# Patient Record
Sex: Male | Born: 1960 | Race: White | Hispanic: No | Marital: Married | Smoking: Former smoker
Health system: Southern US, Community
[De-identification: ages and names within clinical notes are randomized; demographics above are authoritative.]

## PROBLEM LIST (undated history)

## (undated) DIAGNOSIS — E871 Hypo-osmolality and hyponatremia: Secondary | ICD-10-CM

## (undated) DIAGNOSIS — M109 Gout, unspecified: Secondary | ICD-10-CM

## (undated) DIAGNOSIS — D649 Anemia, unspecified: Secondary | ICD-10-CM

## (undated) DIAGNOSIS — K5090 Crohn's disease, unspecified, without complications: Secondary | ICD-10-CM

## (undated) DIAGNOSIS — K2100 Gastro-esophageal reflux disease with esophagitis, without bleeding: Secondary | ICD-10-CM

## (undated) DIAGNOSIS — K5190 Ulcerative colitis, unspecified, without complications: Secondary | ICD-10-CM

## (undated) DIAGNOSIS — E612 Magnesium deficiency: Secondary | ICD-10-CM

## (undated) DIAGNOSIS — H409 Unspecified glaucoma: Secondary | ICD-10-CM

## (undated) DIAGNOSIS — K2970 Gastritis, unspecified, without bleeding: Secondary | ICD-10-CM

## (undated) DIAGNOSIS — E119 Type 2 diabetes mellitus without complications: Secondary | ICD-10-CM

## (undated) DIAGNOSIS — H25019 Cortical age-related cataract, unspecified eye: Secondary | ICD-10-CM

## (undated) DIAGNOSIS — K649 Unspecified hemorrhoids: Secondary | ICD-10-CM

## (undated) HISTORY — PX: OTHER SURGICAL HISTORY: SHX169

## (undated) HISTORY — PX: TONSILLECTOMY: SUR1361

## (undated) HISTORY — DX: Crohn's disease, unspecified, without complications: K50.90

## (undated) HISTORY — PX: COLONOSCOPY: SHX174

## (undated) HISTORY — PX: FRACTURE SURGERY: SHX138

## (undated) HISTORY — PX: BACK SURGERY: SHX140

## (undated) HISTORY — PX: EYE SURGERY: SHX253

---

## 2008-03-05 ENCOUNTER — Encounter: Payer: Self-pay | Admitting: Internal Medicine

## 2008-03-05 ENCOUNTER — Ambulatory Visit: Payer: Self-pay | Admitting: Internal Medicine

## 2008-03-05 ENCOUNTER — Observation Stay (HOSPITAL_COMMUNITY): Admission: EM | Admit: 2008-03-05 | Discharge: 2008-03-06 | Payer: Self-pay | Admitting: Emergency Medicine

## 2008-03-05 IMAGING — CR DG CHEST 2V
2 series · 2 of 2 positions shown · non-contrast
Comparison: None

CLINICAL DATA: Chest pain.

CHEST - 2 VIEW

[w chest pa]
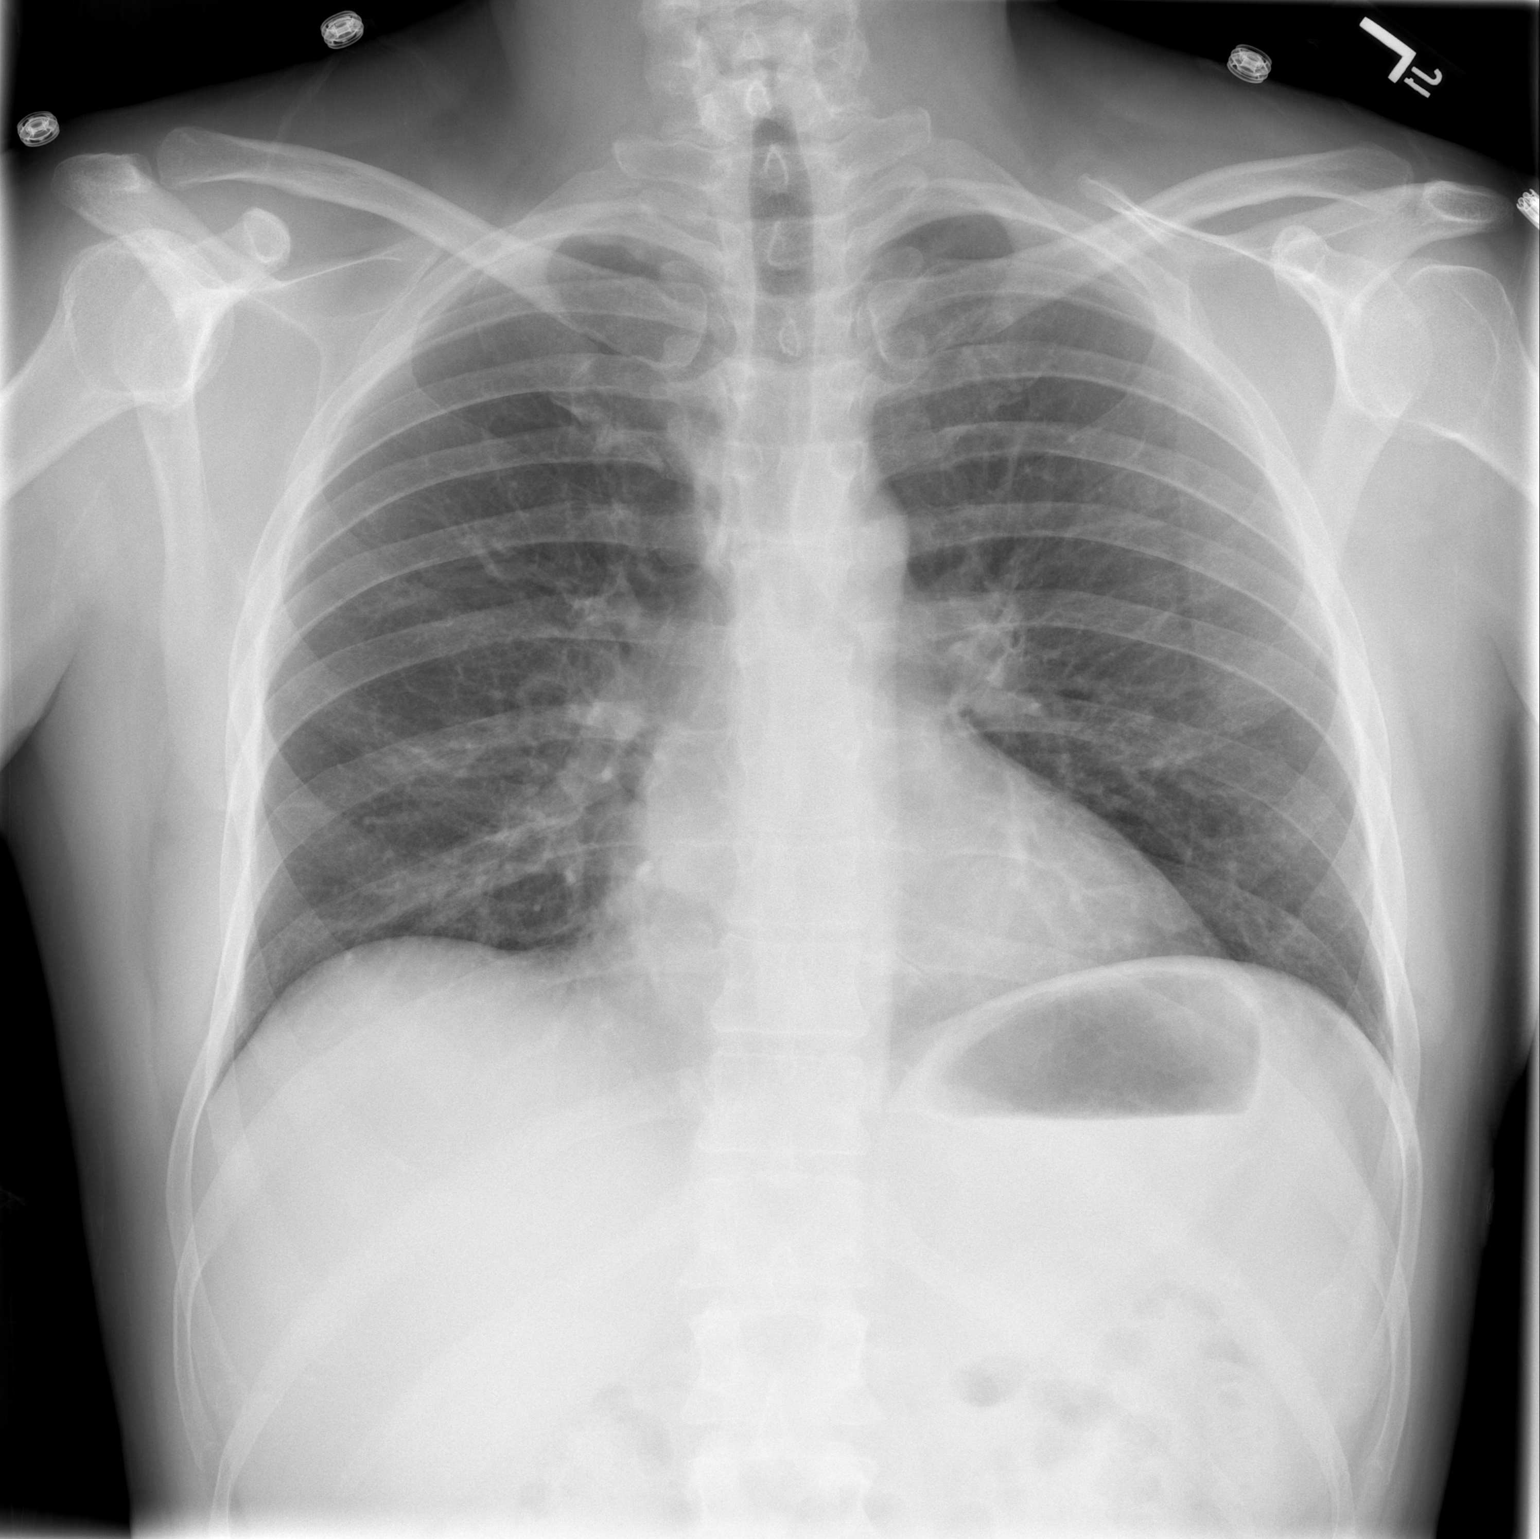

[w chest lat]
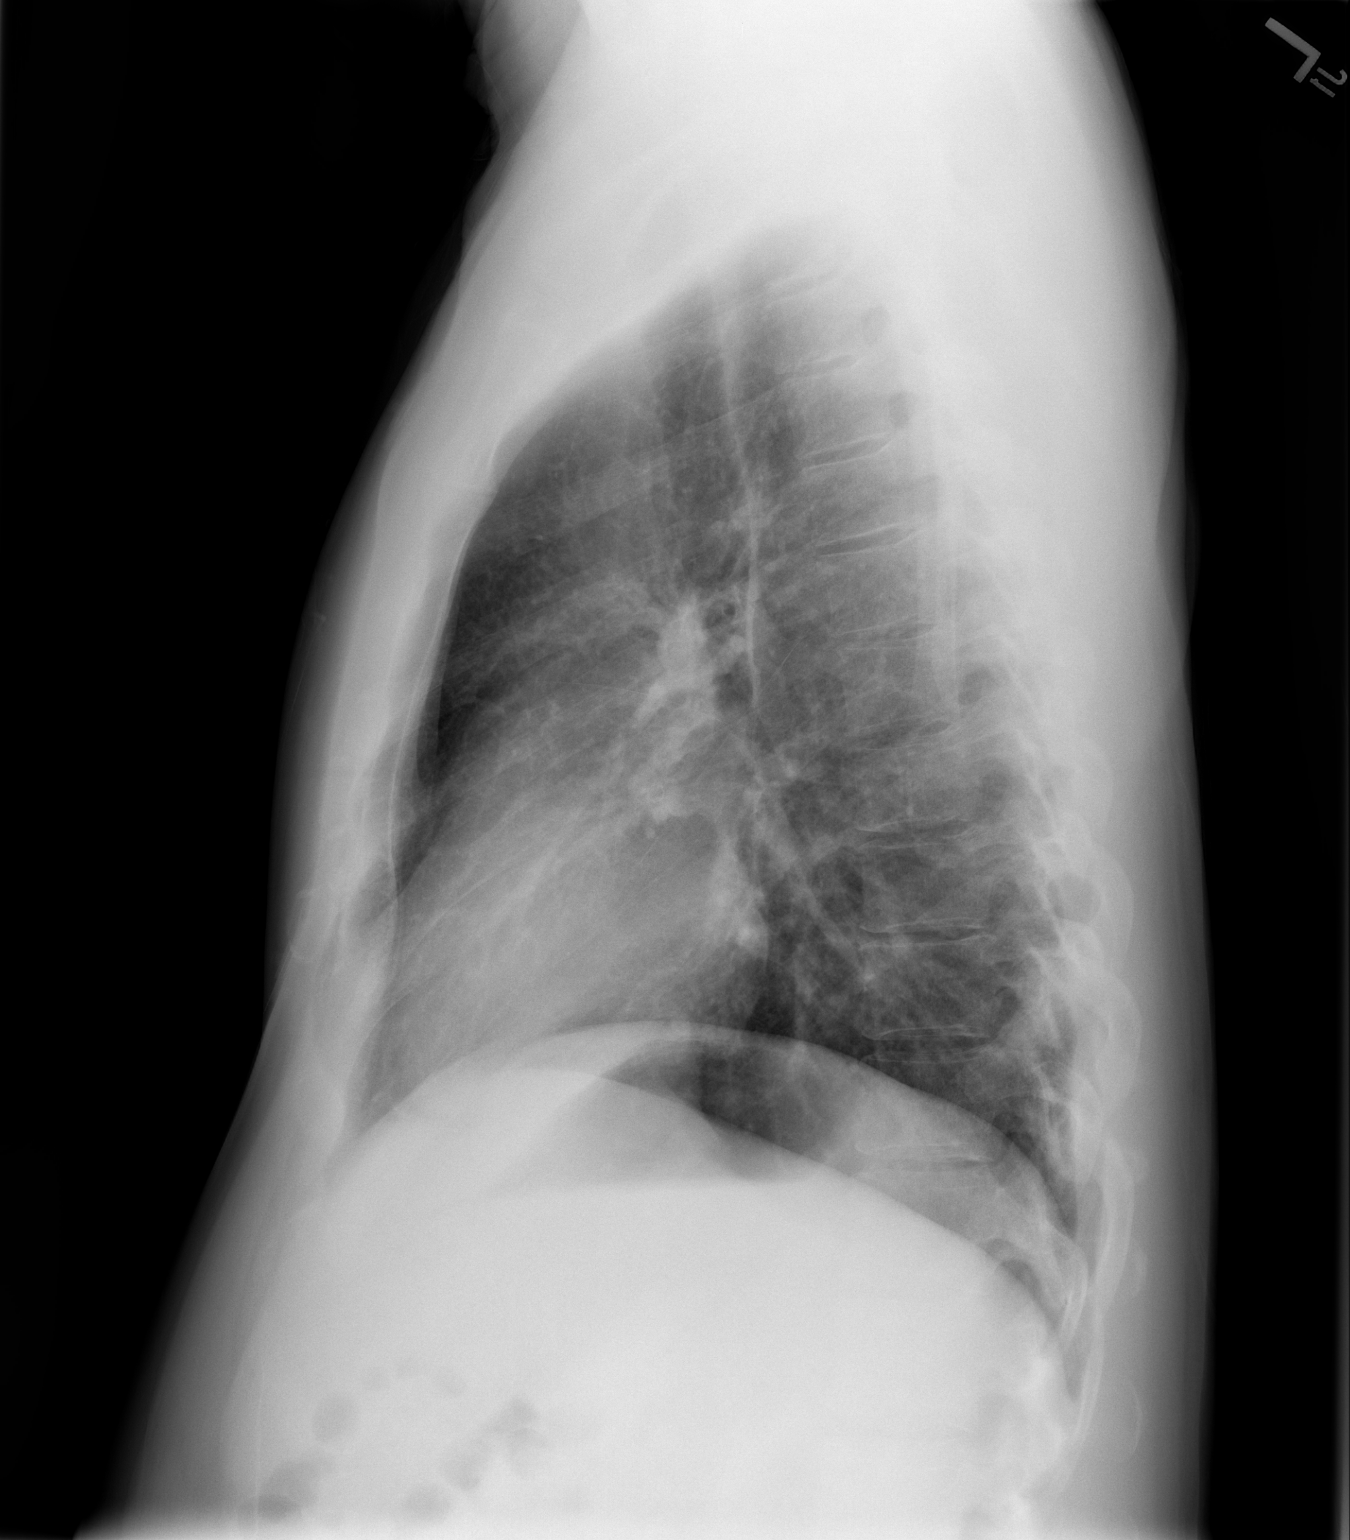

[2 of 2 positions shown; findings below may reference images not displayed]

FINDINGS: Trachea midline.  Heart size is accentuated by slightly
low lung volumes.  Lungs are clear.  No pleural fluid.
IMPRESSION: No acute findings.

## 2009-06-25 ENCOUNTER — Ambulatory Visit (HOSPITAL_COMMUNITY): Admission: RE | Admit: 2009-06-25 | Discharge: 2009-06-25 | Payer: Self-pay | Admitting: Neurosurgery

## 2010-03-23 ENCOUNTER — Ambulatory Visit (HOSPITAL_COMMUNITY): Admission: RE | Admit: 2010-03-23 | Discharge: 2010-03-23 | Payer: Self-pay | Admitting: Neurosurgery

## 2010-06-14 ENCOUNTER — Ambulatory Visit: Payer: Self-pay | Admitting: Family Medicine

## 2010-06-23 ENCOUNTER — Ambulatory Visit: Payer: Self-pay | Admitting: Family Medicine

## 2010-07-22 ENCOUNTER — Ambulatory Visit: Payer: Self-pay | Admitting: Family Medicine

## 2010-08-04 LAB — SURGICAL PCR SCREEN
MRSA, PCR: NEGATIVE
Staphylococcus aureus: POSITIVE — AB

## 2010-08-04 LAB — BASIC METABOLIC PANEL
BUN: 13 mg/dL (ref 6–23)
Calcium: 9.5 mg/dL (ref 8.4–10.5)
Creatinine, Ser: 1.09 mg/dL (ref 0.4–1.5)
GFR calc non Af Amer: >60 mL/min (ref >60)
Glucose, Bld: 236 mg/dL — ABNORMAL HIGH (ref 70–99)
Sodium: 134 mEq/L — ABNORMAL LOW (ref 135–145)

## 2010-08-04 LAB — CBC
MCHC: 33.7 g/dL (ref 30.0–36.0)
Platelets: 116 10*3/uL — ABNORMAL LOW (ref 150–400)
RDW: 12.6 % (ref 11.5–15.5)

## 2010-08-11 LAB — HEPATIC FUNCTION PANEL
ALT: 34 U/L (ref 0–53)
AST: 27 U/L (ref 0–37)
Indirect Bilirubin: 0.6 mg/dL (ref 0.3–0.9)
Total Protein: 7.2 g/dL (ref 6.0–8.3)

## 2010-08-11 LAB — BASIC METABOLIC PANEL
BUN: 8 mg/dL (ref 6–23)
Creatinine, Ser: 0.86 mg/dL (ref 0.4–1.5)
GFR calc non Af Amer: >60 mL/min (ref >60)

## 2010-08-11 LAB — CBC
Platelets: 154 10*3/uL (ref 150–400)
WBC: 9.2 10*3/uL (ref 4.0–10.5)

## 2010-10-08 NOTE — Discharge Summary (Signed)
NAMESIERRA, Hicks NO.:  [id number]   MEDICAL RECORD NO.:  [id number]          PATIENT TYPE:  OBV   LOCATION:  2037                         FACILITY:  [facility]   PHYSICIAN:  Manning Charity, MD     DATE OF BIRTH:  08-18-1960   DATE OF ADMISSION:  03/05/2008  DATE OF DISCHARGE:  03/06/2008                               DISCHARGE SUMMARY   ATTENDING PHYSICIAN:  Manning Charity, MD   DISCHARGE DIAGNOSES:  1. Chest pain, presumed gastrointestinal in origin given negative      cardiac workup and history of ethanol abuse and heavy use of      nonsteroidal anti-inflammatory drugs.  2. Ethanol abuse.  3. Tobacco abuse.  4. Glaucoma.  5. Gout.   DISCHARGE MEDICATIONS:  1. Lumigan eye drops use as directed.  2. Colchicine 0.6 mg p.o. every other day.  3. Tramadol 50 mg p.o. q.4-6 h. p.r.n. pain.  4. Omeprazole 40 mg p.o. q.12 h.   DISCHARGE CONDITION AND FOLLOWUP:  The patient was discharged in stable  condition after having been ruled out for acute myocardial infarction  with negative EKGs and cardiac enzymes.  He is to follow up with Dr.  Elizebeth Koller in the outpatient clinic at 12:30 p.m. on April 03, 2008.  At that time, items which need to be addressed are,  1. Gout: is his current colchicine dose is effective preventing gout      flares? We should consider checking 24h urine for uric acid to      determine excretion. Also, in the future he will be a candidate for      allopurinol once he is not have acute attacks.  2. His alcohol use and whether he has been successful in cutting down.  3. Reviewing with him the importance of avoiding NSAIDs for pain      management given their likelihood to exacerbate gastritis      especially in combination with alcohol.  4. He will need referral to cardiology for outpatient evaluation given      his chest pain (likely he will need an exercise stress test).   PROCEDURES PERFORMED:  Chest x-ray dated March 05, 2008 showed  no  acute findings.  Abdominal x-ray dated March 05, 2008, showed a  nonspecific bowel gas pattern, scattered left-sided small bowel air-  fluid levels with borderline distended bowel.  A 2-D echocardiogram  dated March 05, 2008, showed overall left ventricular systolic  function was vigorous.  Left ventricular ejection fraction estimated to  be 65%.  There were no left ventricular regional wall motion  abnormalities.  Features were consistent with pseudonormal left  ventricular filling pattern with concomitant abnormal relaxation and  increased filling pressure.  It was technically a limited study due to  poor sound wave transmission.  There was mild mitral annular  calcification.  The patient needs an exercise stress test as an  outpatient.   BRIEF ADMITTING HISTORY AND PHYSICAL:  The patient is a 50 year old man  with past medical history significant for tobacco abuse, ethanol abuse  as well as gout and chronic Advil usage who  presented to the emergency  room with chest pain.  He reports onset of chest pain at 7:45 a.m. on  the day of admission, lasting approximately 45 minutes.  He stayed at  work and had 2 more brief episodes, each of which lasted a couple  minutes.  First episode was associated with diaphoresis.  He describes  the chest pain as epigastric and below the sternum, it is like pressure  without radiation.  He has had no similar episodes previously.  It was  initially 8/10, but was 5/10 in the ED.  There were no aggravating or  alleviating factors.  No sick contacts, no shortness of breath, no  cough, no fevers, no chills.  The patient drinks at least a 6-pack per  day and uses Advil daily for arthritis pain and gout.   PHYSICAL EXAMINATION:  Vitals; blood pressure 159/90, pulse 72,  respirations 17, oxygen saturation 99% on 2 liters.  In general, he was  in no acute distress.  There was no JVD.  Lungs were clear to  auscultation bilaterally.  Heart had regular  rate and rhythm.  Normal S1  and S2.  No murmurs, rubs, or gallops.  Abdomen was soft, nontender.  Bowel sounds were positive.  There was no guarding or rebound.  Extremities had no edema.  The neuro exam was nonfocal.   Labs were sodium 138, potassium 4.2, chloride 105, bicarb 25, BUN 9,  creatinine 1.0, glucose 156.  Point of care cardiac markers were  negative.  Hemoglobin was 16.7.  Chest x-ray was obtained with findings  described above.   HOSPITAL COURSE:  1. Chest/epigastric pain:  The patient was ruled out for acute      myocardial infarction with negative serial cardiac enzymes and      EKGs.  His 2-D echo had the findings described above.  His lipase      was 35.  His BNP was less than 30.  His urine drug screen was      negative.  His LFTs were within normal limits.  The most likely      etiology for his epigastric pain was GERD/peptic ulcer disease      given heavy ethanol use combined with NSAID use.  His risk factor      for coronary artery disease is former tobacco use, but he had a      very low pretest probability and therefore can receive cardiac      workup as an outpatient.  The patient was advised to stop his      alcohol intake (he was given the phone number of AA) and to stop      using NSAIDs.  2. Gout:  The patient was advised to stop using NSAIDs for his gout.      He was given a prescription for colchicine instead as well as      tramadol for pain.  Once he is free of any acute attack in the      future, we can consider to start allopurinol.  He was also advised      to avoid Tylenol given his history of ethanol use.   DISCHARGE LABORATORIES AND VITALS:  Temperature 96.7, pulse 75,  respirations 18, blood pressure 126/83, oxygen saturation 100% on room  air.  White blood count 5.9, hemoglobin 15.4, platelets 165.  Sodium  136, potassium 3.7, chloride 104, bicarb 25, glucose 128, BUN 12,  creatinine 0.89, calcium 9.4.      Theron Arista  Janyth Pupa, MD   Electronically Signed      Manning Charity, MD  Electronically Signed    PN/MEDQ  D:  03/10/2008  T:  03/11/2008  Job:  [id number]

## 2011-02-22 LAB — RAPID URINE DRUG SCREEN, HOSP PERFORMED
Amphetamines: NOT DETECTED
Benzodiazepines: NOT DETECTED
Cocaine: NOT DETECTED
Opiates: NOT DETECTED
Tetrahydrocannabinol: NOT DETECTED

## 2011-02-22 LAB — COMPREHENSIVE METABOLIC PANEL
AST: 35
BUN: 10
CO2: 24
Calcium: 9.7
Creatinine, Ser: 0.82
GFR calc Af Amer: >60
GFR calc non Af Amer: >60
Glucose, Bld: 148 — ABNORMAL HIGH
Total Bilirubin: 0.7

## 2011-02-22 LAB — LIPID PANEL
Total CHOL/HDL Ratio: 5.5
VLDL: 40

## 2011-02-22 LAB — GLUCOSE, CAPILLARY
Glucose-Capillary: 130 — ABNORMAL HIGH
Glucose-Capillary: 87

## 2011-02-22 LAB — VITAMIN B12: Vitamin B-12: 534 (ref 211–911)

## 2011-02-22 LAB — CBC
HCT: 45.5
Hemoglobin: 15.4
MCHC: 33.9
RBC: 4.61
RDW: 13.2

## 2011-02-22 LAB — FOLATE RBC: RBC Folate: 752 — ABNORMAL HIGH

## 2011-02-22 LAB — FOLATE: Folate: 12.5

## 2011-02-22 LAB — AMYLASE: Amylase: 86

## 2011-02-22 LAB — HEMOGLOBIN A1C
Hgb A1c MFr Bld: 6
Mean Plasma Glucose: 126

## 2011-02-22 LAB — BASIC METABOLIC PANEL
BUN: 12
Calcium: 9.4
Creatinine, Ser: 0.89
GFR calc non Af Amer: >60
Glucose, Bld: 128 — ABNORMAL HIGH
Sodium: 136

## 2011-02-22 LAB — POCT I-STAT, CHEM 8
BUN: 9
Calcium, Ion: 1.24
Chloride: 105
Glucose, Bld: 156 — ABNORMAL HIGH
Potassium: 4.2

## 2011-02-22 LAB — D-DIMER, QUANTITATIVE: D-Dimer, Quant: 0.36

## 2011-02-22 LAB — OCCULT BLOOD X 1 CARD TO LAB, STOOL: Fecal Occult Bld: NEGATIVE

## 2011-02-22 LAB — POCT CARDIAC MARKERS
Myoglobin, poc: 37.2
Troponin i, poc: <0.05

## 2011-02-22 LAB — PROTIME-INR: Prothrombin Time: 13.5

## 2011-02-22 LAB — CK TOTAL AND CKMB (NOT AT ARMC): Relative Index: INVALID

## 2011-02-22 LAB — B-NATRIURETIC PEPTIDE (CONVERTED LAB): Pro B Natriuretic peptide (BNP): <30

## 2011-02-22 LAB — CARDIAC PANEL(CRET KIN+CKTOT+MB+TROPI)
CK, MB: 1
Total CK: 46

## 2011-05-03 ENCOUNTER — Ambulatory Visit: Payer: Self-pay | Admitting: Gastroenterology

## 2011-05-04 LAB — PATHOLOGY REPORT

## 2013-02-26 ENCOUNTER — Ambulatory Visit: Payer: Self-pay | Admitting: Surgery

## 2013-02-26 LAB — CBC WITH DIFFERENTIAL/PLATELET
Basophil #: 0 x10 3/mm 3
Basophil %: 0.6 %
Eosinophil #: 0.1 x10 3/mm 3
Eosinophil %: 1.6 %
HCT: 51.2 %
HGB: 16.7 g/dL
Lymphocyte %: 17 %
Lymphs Abs: 1.1 x10 3/mm 3
MCH: 32.6 pg
MCHC: 32.5 g/dL
MCV: 100 fL
Monocyte #: 0.4 "x10 3/mm "
Monocyte %: 6.9 %
Neutrophil #: 4.6 x10 3/mm 3
Neutrophil %: 73.9 %
Platelet: 173 x10 3/mm 3
RBC: 5.11 x10 6/mm 3
RDW: 14 %
WBC: 6.2 x10 3/mm 3

## 2013-02-26 LAB — BASIC METABOLIC PANEL WITH GFR
Anion Gap: 8
BUN: 9 mg/dL
Calcium, Total: 8.9 mg/dL
Chloride: 97 mmol/L — ABNORMAL LOW
Co2: 28 mmol/L
Creatinine: 1.11 mg/dL
EGFR (African American): >60
EGFR (Non-African Amer.): >60
Glucose: 371 mg/dL — ABNORMAL HIGH
Osmolality: 280
Potassium: 3.5 mmol/L
Sodium: 133 mmol/L — ABNORMAL LOW

## 2013-03-07 ENCOUNTER — Ambulatory Visit: Payer: Self-pay | Admitting: Surgery

## 2013-04-15 ENCOUNTER — Inpatient Hospital Stay: Payer: Self-pay | Admitting: Internal Medicine

## 2013-04-15 LAB — URINALYSIS, COMPLETE
Bilirubin,UR: NEGATIVE
Blood: NEGATIVE
Glucose,UR: 50 mg/dL (ref 0–75)
Ph: 6 (ref 4.5–8.0)
Protein: NEGATIVE
RBC,UR: 1 /HPF (ref 0–5)
Specific Gravity: 1.036 (ref 1.003–1.030)
Squamous Epithelial: <1
WBC UR: <1 /HPF (ref 0–5)

## 2013-04-15 LAB — RAPID INFLUENZA A&B ANTIGENS

## 2013-04-15 LAB — COMPREHENSIVE METABOLIC PANEL
Albumin: 2.5 g/dL — ABNORMAL LOW (ref 3.4–5.0)
Alkaline Phosphatase: 135 U/L — ABNORMAL HIGH
BUN: 14 mg/dL (ref 7–18)
Calcium, Total: 9.1 mg/dL (ref 8.5–10.1)
Chloride: 90 mmol/L — ABNORMAL LOW (ref 98–107)
Osmolality: 258 (ref 275–301)
Potassium: 4.2 mmol/L (ref 3.5–5.1)
SGOT(AST): 22 U/L (ref 15–37)
Total Protein: 7.7 g/dL (ref 6.4–8.2)

## 2013-04-15 LAB — CBC
HGB: 14 g/dL (ref 13.0–18.0)
MCH: 32 pg (ref 26.0–34.0)
MCV: 94 fL (ref 80–100)
RBC: 4.36 10*6/uL — ABNORMAL LOW (ref 4.40–5.90)
WBC: 10.6 10*3/uL (ref 3.8–10.6)

## 2013-04-15 LAB — LIPASE, BLOOD: Lipase: 58 U/L — ABNORMAL LOW (ref 73–393)

## 2013-04-16 LAB — CBC WITH DIFFERENTIAL/PLATELET
Basophil #: 0 10*3/uL (ref 0.0–0.1)
Eosinophil #: 0 10*3/uL (ref 0.0–0.7)
HGB: 12.7 g/dL — ABNORMAL LOW (ref 13.0–18.0)
Lymphocyte #: 0.8 10*3/uL — ABNORMAL LOW (ref 1.0–3.6)
Lymphocyte %: 10 %
MCH: 32.2 pg (ref 26.0–34.0)
MCHC: 34.4 g/dL (ref 32.0–36.0)
Platelet: 265 10*3/uL (ref 150–440)
RDW: 13 % (ref 11.5–14.5)
WBC: 8.2 10*3/uL (ref 3.8–10.6)

## 2013-04-16 LAB — MAGNESIUM: Magnesium: 1.3 mg/dL — ABNORMAL LOW

## 2013-04-16 LAB — COMPREHENSIVE METABOLIC PANEL
Anion Gap: 8 (ref 7–16)
Bilirubin,Total: 0.5 mg/dL (ref 0.2–1.0)
Creatinine: 0.85 mg/dL (ref 0.60–1.30)
EGFR (African American): >60
Glucose: 132 mg/dL — ABNORMAL HIGH (ref 65–99)
Osmolality: 259 (ref 275–301)
SGOT(AST): 24 U/L (ref 15–37)
SGPT (ALT): 20 U/L (ref 12–78)
Total Protein: 6.5 g/dL (ref 6.4–8.2)

## 2013-04-16 LAB — CLOSTRIDIUM DIFFICILE(ARMC)

## 2013-04-17 LAB — CBC WITH DIFFERENTIAL/PLATELET
Basophil %: 0.6 %
Eosinophil %: 0.4 %
HCT: 32.1 % — ABNORMAL LOW (ref 40.0–52.0)
Lymphocyte #: 1 10*3/uL (ref 1.0–3.6)
MCH: 32 pg (ref 26.0–34.0)
MCHC: 34.5 g/dL (ref 32.0–36.0)
Monocyte #: 0.9 x10 3/mm (ref 0.2–1.0)
Neutrophil #: 3.9 10*3/uL (ref 1.4–6.5)
Neutrophil %: 65.5 %
RBC: 3.46 10*6/uL — ABNORMAL LOW (ref 4.40–5.90)

## 2013-04-17 LAB — HEPATIC FUNCTION PANEL A (ARMC): Total Protein: 5.7 g/dL — ABNORMAL LOW (ref 6.4–8.2)

## 2013-04-18 LAB — STOOL CULTURE

## 2013-04-19 LAB — BASIC METABOLIC PANEL
Anion Gap: 7 (ref 7–16)
BUN: 4 mg/dL — ABNORMAL LOW (ref 7–18)
Chloride: 101 mmol/L (ref 98–107)
Co2: 26 mmol/L (ref 21–32)
EGFR (African American): >60
Osmolality: 266 (ref 275–301)
Potassium: 2.4 mmol/L — CL (ref 3.5–5.1)
Sodium: 134 mmol/L — ABNORMAL LOW (ref 136–145)

## 2013-04-19 LAB — CBC WITH DIFFERENTIAL/PLATELET
Basophil #: 0 10*3/uL (ref 0.0–0.1)
Eosinophil #: 0.1 10*3/uL (ref 0.0–0.7)
HCT: 32.5 % — ABNORMAL LOW (ref 40.0–52.0)
MCV: 93 fL (ref 80–100)
Monocyte #: 0.8 x10 3/mm (ref 0.2–1.0)
WBC: 5.4 10*3/uL (ref 3.8–10.6)

## 2013-04-19 LAB — MAGNESIUM: Magnesium: 0.9 mg/dL — ABNORMAL LOW

## 2013-04-20 LAB — CULTURE, BLOOD (SINGLE)

## 2013-04-27 LAB — WBCS, STOOL

## 2013-06-07 ENCOUNTER — Ambulatory Visit: Payer: Self-pay | Admitting: Gastroenterology

## 2013-06-07 LAB — CBC WITH DIFFERENTIAL/PLATELET
BASOS ABS: 0 10*3/uL (ref 0.0–0.1)
Basophil %: 0.7 %
Eosinophil #: 0.1 10*3/uL (ref 0.0–0.7)
Eosinophil %: 2.1 %
HCT: 40.1 % (ref 40.0–52.0)
HGB: 13.6 g/dL (ref 13.0–18.0)
Lymphocyte #: 1.9 10*3/uL (ref 1.0–3.6)
Lymphocyte %: 28 %
MCH: 32.2 pg (ref 26.0–34.0)
MCHC: 33.8 g/dL (ref 32.0–36.0)
MCV: 95 fL (ref 80–100)
MONOS PCT: 5.6 %
Monocyte #: 0.4 x10 3/mm (ref 0.2–1.0)
NEUTROS PCT: 63.6 %
Neutrophil #: 4.3 10*3/uL (ref 1.4–6.5)
PLATELETS: 217 10*3/uL (ref 150–440)
RBC: 4.2 10*6/uL — ABNORMAL LOW (ref 4.40–5.90)
RDW: 15.5 % — ABNORMAL HIGH (ref 11.5–14.5)
WBC: 6.8 10*3/uL (ref 3.8–10.6)

## 2013-06-07 LAB — SEDIMENTATION RATE: ERYTHROCYTE SED RATE: 24 mm/h — AB (ref 0–20)

## 2013-06-13 LAB — PATHOLOGY REPORT

## 2014-03-07 ENCOUNTER — Ambulatory Visit: Payer: Self-pay | Admitting: Gastroenterology

## 2014-03-07 LAB — CLOSTRIDIUM DIFFICILE(ARMC)

## 2014-08-01 ENCOUNTER — Other Ambulatory Visit: Payer: Self-pay | Admitting: Gastroenterology

## 2014-09-12 NOTE — Discharge Summary (Signed)
PATIENT NAME:  Timothy Hicks, Timothy Hicks MR#:  [id number] DATE OF BIRTH:  1960/08/16  DATE OF ADMISSION:  04/15/2013 DATE OF DISCHARGE:  04/19/2013  ADMITTING PHYSICIAN: Brinson Sink, MD  DISCHARGING PHYSICIAN: Gladstone Lighter, MD  PRIMARY CARE PHYSICIAN: Juluis Pitch, MD  [place]: Gastroenterology by Dr. Gustavo Lah and Dr. Allen Norris.   DISCHARGE DIAGNOSES: 1.  Systemic antiinflammatory response syndrome. 2.  Acute sigmoid diverticulitis/colitis.  3.  Type 2 diabetes mellitus.  4.  Chronic low back pain with degenerative disk disease.  5.  History of diverticulosis. 6.  Hypokalemia while in the hospital secondary to diarrhea.   DISCHARGE HOME MEDICATIONS: 1.  Flexeril 10 mg p.o. b.i.d.  2.  Lidoderm patch 5% once a day.  3.  Lyrica 75 mg p.o. at bedtime.  4.  Norco 5/325 mg 1 tablet q. 6 hours p.r.n. for abdominal pain.  5.  Imodium 2 mg 4 times a day as needed for diarrhea.  6.  Benadryl 25 mg p.o. q. 6 hours p.r.n. for itching for back rash. 7.  Dicyclomine 10 mg p.o. 4 times a day. 8.  Simethicone 160 mg q. 4 hours p.r.n. for gas/bloating.  9.  Levaquin 500 mg p.o. daily for 6 days.  10.  Flagyl 500 mg p.o. q. 8 hours for 6 more days.  DISCHARGE DIET: Low residue.  DISCHARGE ACTIVITY: As tolerated.    FOLLOWUP INSTRUCTIONS: 1.  PCP followup in 3 to 4 weeks.  2.  Follow up with [facility] in 2 to 3 weeks.   LABORATORY AND IMAGING STUDIES: Prior to discharge: WBC 5.4, hemoglobin 11.3, hematocrit 32.5, and platelet count 318.   Sodium 134, potassium 2.4, chloride 101, bicarb 26, BUN 4, creatinine 0.72, glucose 119, and calcium of 8.1. However, potassium was replaced and repeat potassium is pending at this time.   Total bilirubin 0.6, alk phos 95, ALT 20, AST 17, albumin 1.8. Stool studies including for parasites were negative. Stool for C. diff is negative. Stool cultures are negative at this time. HbA1c is slightly elevated at 7.7    BRIEF HOSPITAL COURSE: Timothy Hicks is a 54 year old Caucasian male with past medical history significant for non-insulin-dependent diabetes mellitus, chronic low back pain, and diverticulosis who presents to the hospital secondary to 3 week history of diarrhea associated with lower abdominal pain and also fevers.  1.  Systemic inflammatory response syndrome secondary to acute diverticulitis and also colitis in the sigmoid region. The patient had a CT of the abdomen and pelvis done which showed no evidence of obstruction; however, there is focal colitis and diverticulitis in distal descending colon and also proximal sigmoid region. His white count improved. He had fevers over the first couple of days, but has not had any further fevers over the last 48 hours. His pain was persistent and also his diarrhea was persistent. His stool cultures were negative. He was initially on Cipro and Flagyl with no improvement. Was changed over to Invanz and Flagyl and that showed  improvement in his symptoms. He still has 2 out of 10 abdominal pain for which he was taking Norco as needed, which will be continued at the time of discharge. The patient did not have any nausea or vomiting and was able to tolerate a soft diet prior to discharge. His diarrhea has started to become more solid. His last colonoscopy was about 3 years ago, which was normal, and the patient will follow up as an outpatient with GI in 4 weeks for  a repeat colonoscopy.  2.  Acute severe hypokalemia, likely secondary to his diarrhea. Being replaced aggressively and repeat potassium is pending. If that is greater than 3.0, the patient will be discharged home.  3.  Diabetes mellitus. He was on metformin, but with the onset of diarrhea his metformin is being suspended and diet control is advised at this time.  4.  Chronic back pain. His home medications are being continued, except  NSAIDs with the risk of GI bleed.  CODE STATUS: FULL code.   His course has  been otherwise uneventful in the hospital.   DISCHARGE CONDITION: Stable.   DISCHARGE DISPOSITION: Home.   TIME SPENT ON DISCHARGE: 40 minutes. ____________________________ Gladstone Lighter, MD [id number] D: 04/19/2013 11:08:16 ET T: 04/19/2013 11:27:54 ET JOB#: [id number]  cc: Gladstone Lighter, MD, <Dictator> [name], MD Youlanda Roys. Lovie Macadamia, MD Gladstone Lighter MD ELECTRONICALLY SIGNED 05/09/2013 7:24

## 2014-09-12 NOTE — Consult Note (Signed)
Chief Complaint:  Subjective/Chief Complaint Pt c/o LLQ pain 5/10.  He has had 4 loose watery stools x 24 hrs.  Denies N/V.  Tolerating liquids well.  Hgb dropped from 14-11.  C diff negative.   VITAL SIGNS/ANCILLARY NOTES: **Vital Signs.:   26-Nov-14 09:04  Vital Signs Type Q 4hr  Temperature Temperature (F) 100.6  Celsius 38.1  Temperature Source oral  Pulse Pulse 103  Respirations Respirations 20  Systolic BP Systolic BP 629  Diastolic BP (mmHg) Diastolic BP (mmHg) 78  Mean BP 93  Pulse Ox % Pulse Ox % 96  Pulse Ox Activity Level  At rest  Oxygen Delivery Room Air/ 21 %   Brief Assessment:  GEN well nourished, no acute distress, A/Ox3   Cardiac Regular   Respiratory normal resp effort   Gastrointestinal Normal   Gastrointestinal details normal Nondistended  Bowel sounds normal  No rebound tenderness  No gaurding  +mild LLQ pain on deep palpation.   EXTR negative edema, No cyanosis   Additional Physical Exam Skin: warm, dry , no rash   Lab Results: Hepatic:  26-Nov-14 06:10   Bilirubin, Total 0.6  Bilirubin, Direct 0.2 (Result(s) reported on 17 Apr 2013 at 07:12AM.)  Alkaline Phosphatase 95 (45-117 NOTE: New Reference Range 04/12/13)  SGPT (ALT) 20  SGOT (AST) 17  Total Protein, Serum  5.7  Albumin, Serum  1.8  Routine Hem:  26-Nov-14 06:10   WBC (CBC) 5.9  RBC (CBC)  3.46  Hemoglobin (CBC)  11.1  Hematocrit (CBC)  32.1  Platelet Count (CBC) 251  MCV 93  MCH 32.0  MCHC 34.5  RDW 13.0  Neutrophil % 65.5  Lymphocyte % 17.7  Monocyte % 15.8  Eosinophil % 0.4  Basophil % 0.6  Neutrophil # 3.9  Lymphocyte # 1.0  Monocyte # 0.9  Eosinophil # 0.0  Basophil # 0.0 (Result(s) reported on 17 Apr 2013 at 06:50AM.)   Assessment/Plan:  Assessment/Plan:  Assessment Acute colitis vs. diverticulitis of distal descending/proximal sigmoid colon: Pt on cipro/flagyl with minimal symptomatic improvement.  Hgb 14, down to 11 now without gross GI bleeding.   Colonoscopy 2012 by Dr. Gustavo Lah did show mild acute inflammation at cecum & sigmoid diverticulosis but was otherwise benign including random biopsies.  C diff negative.  Awaiting stool culture.  Cannot r/o evolving inflammatory bowel disease.   Plan Plan: 1) Continue IVFs, pain control, antiemetics 2) FU stool cx/giardia 3) Continue cipro & flagyl 4) Continue protonix BID  5) Follow Hgb 6) Consider gentle flex sig if stools benign & no improvement with ongoing antibiotics  Please call if you have any questions or concerns.  Dr. Gustavo Lah will be covering tomorrow in our absence.   Electronic Signatures: Andria Meuse (NP)  (Signed [phone] 09:36)  Authored: Chief Complaint, VITAL SIGNS/ANCILLARY NOTES, Brief Assessment, Lab Results, Assessment/Plan   Last Updated: 26-Nov-14 09:36 by Andria Meuse (NP)

## 2014-09-12 NOTE — Consult Note (Signed)
PATIENT NAME:  Timothy Hicks, Timothy Hicks MR#:  [id number] DATE OF BIRTH:  [birth year]  DATE OF CONSULTATION:  04/15/2013  REFERRING PHYSICIAN: Dr. James Ivanoff  CONSULTING PHYSICIAN:  Dr. Lucilla Lame  PRIMARY CARE PHYSICIAN: Dr. Lovie Macadamia.  PRIMARY GASTROENTEROLOGIST: Dr. Gustavo Lah.   REASON FOR CONSULTATION: Acute colitis.   HISTORY OF PRESENT ILLNESS: Timothy Hicks is a pleasant 54 year old Caucasian male, who was in his usual state of health until approximately 3 to 4 weeks ago. He tells me he developed severe diarrhea with up to 4 loose stools per day. He has been seeing mucus in his stools as well. He reports a 25 pound weight loss in the past 6 weeks. He is having lower abdominal cramps and pain in the left lower quadrant greater than the right. He rates the pain 8 out of 10 on pain scale. The pain twinges usually last 10 to 15 minutes. He has had loss of appetite. He did have a colonoscopy back in December 2012 by Dr. Gustavo Lah, which showed mild inflammation of the cecum secondary to mild active colitis on biopsies. He did have sigmoid diverticula and benign random colon biopsies from both the right and left colon. He had a CT of abdomen and pelvis with IV and oral contrast, which showed distal descending and proximal sigmoid colon colitis or diverticulitis, but was otherwise unremarkable.   He denies any foreign travel, new medications and recent antibiotics or exotic or new pets. He denies any ill contacts. He has reportedly had some stool studies done through Dr. Reuel Boom office, which have been inconclusive. He has had a T-max of 104 at home and tachycardia.   PAST MEDICAL AND SURGICAL HISTORY: Colonoscopy 2012, as described in HPI, right shoulder mass, biopsy was benign, diabetes mellitus, degenerative joint disease, tonsillectomy and diskectomy on his back.   MEDICATIONS PRIOR TO ADMISSION: Cyclobenzaprine 10 mg b.i.d., etodolac 500 mg b.i.d., Lidoderm 5% topical film daily, Lyrica 75 mg  at bedtime and metformin 1 gram b.i.d.   ALLERGIES: No known drug allergies.   FAMILY HISTORY: The patient is adopted and so family history is unknown.   SOCIAL HISTORY: He chews 2 cans tobacco per day. He generally drinks a 6 pack of beer daily, but has not in the last several weeks since he has been sick. He denies any illicit drug use.   REVIEW OF SYSTEMS: See HPI. GI: Denies any heartburn, indigestion, dysphagia or odynophagia. He does have some nausea without vomiting. Otherwise, negative complete 10-point review of systems.   PHYSICAL EXAMINATION:  VITAL SIGNS: Temperature 98.5, pulse 112, blood pressure 147/85, respirations 20, oxygen saturation 100% on room air.  GENERAL: He is alert, oriented, pleasant and cooperative, in no acute distress.  HEENT: Sclerae clear and anicteric conjunctivae. Oropharynx pink and moist without any lesions.  NECK: Supple without any mass or thyromegaly.  HEART: Rate is regular with a mild tachycardia, rate around 110.  LUNGS: Clear to auscultation bilaterally.  ABDOMEN: Positive bowel sounds x 4. No bruits auscultated. Abdomen is soft. He does have moderate tenderness to the left lower quadrant and suprapubic region on deep palpation. There is no rebound, tenderness or guarding. No hepatosplenomegaly or mass.  EXTREMITIES: He is without clubbing or edema or cyanosis.  PSYCHIATRIC: Alert, pleasant, cooperative, normal mood and affect.  MUSCULOSKELETAL: Good equal movement and strength bilaterally.  NEUROLOGIC: Grossly intact.   LABORATORY STUDIES: Glucose 173, BUN 9, creatinine 0.85, sodium 129, potassium 3.6, chloride 97, CO2 24, anion gap 8, calcium 8.2, magnesium 1.3.  Hemoglobin A1c 7.7. Lipase 58. LFTs normal except for albumin of 2.1. Hemoglobin 12.7, hematocrit 36.8, white blood cell count 8.2, platelets 265. Blood cultures no growth x 12 hours. Influenza A and B negative. Urinalysis shows 50 mg of glucose, but is otherwise unremarkable.    IMPRESSION: Timothy Hicks is a 55 year old male admitted with acute abdominal pain, fever and diarrhea. CT shows distal descending proximal sigmoid colon diverticulitis versus colitis. His last colonoscopy in 2012 by Dr. Gustavo Lah did show mild acute inflammation of the cecum and sigmoid diverticulosis, but was otherwise benign including random biopsies from the left and right colon. I suspect his acute infectious etiology of his diarrhea and stool studies are pending. Diverticulitis is definitely a consideration given the location of his left lower quadrant pain and his history as well. Less likely would be evolving inflammatory bowel disease.   PLAN:  1.  I agree with IV fluids, pain control and antiemetics.  2.  Full set of stool studies to be followed up on.  3.  C. difficile PCR.  4.  Enteric precautions.  5.  I agree with empiric Cipro and Flagyl as ordered.  6.  I agree with gastric protection in the way of b.i.d. Protonix for now.   We would like to thank you for allowing [place] to participate in the care of Timothy Hicks.  ____________________________ Andria Meuse, NP [id number] D: 04/16/2013 09:43:50 ET T: 04/16/2013 09:52:46 ET JOB#: [id number]  cc: Andria Meuse, NP, <Dictator> Youlanda Roys. Lovie Macadamia, MD [place] ELECTRONICALLY SIGNED 04/29/2013 15:37
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
## 2014-09-12 NOTE — Consult Note (Signed)
Brief Consult Note: Diagnosis: Acute colitis.   Patient was seen by consultant.   Consult note dictated.   Comments: Mr. Timothy Hicks is a 54 y/o male admitted with acute abdominal pain, fever & diarrhea.  CT shows distal descending/proximal sigmoid colon diverticulitis vs colitis.  Last colonoscopy 2012 by Gustavo Lah did show mild acute inflammation at cecum & sigmoid diverticulosis but was otherwise benign including random biopsies.  I suspect acute infectious etiology & stool studies are pending.  Diverticulitis is definitely a consideration given location of his pain & his history as well.  Less likely would be evolving inflammatory bowel disease.  Plan: 1) Agree with IVFs, pain control, antiemetics 2) FU full set of stool studies 3) c diff PCR 4) enteric precautions 5) Agree with empiric cipro & flagyl 6) Agree with gastric protection with BID PPI for now 7) Mag/electrolyte repletion per attending  Thanks for consult.  Please see full dictated note. #[id number].  Electronic Signatures: Andria Meuse (NP)  (Signed [phone] 09:45)  Authored: Brief Consult Note   Last Updated: 25-Nov-14 09:45 by Andria Meuse (NP)

## 2014-09-12 NOTE — Consult Note (Signed)
Chief Complaint:  Subjective/Chief Complaint seen for diverticulitis, lower abdominal pain.  ct with foacl lesion/colitis versus diverticulitis.  Feeling some better today, stool loose, still abd pain lower abdomen bilaterally, mostly llq. no nausea.   VITAL SIGNS/ANCILLARY NOTES: **Vital Signs.:   27-Nov-14 04:01  Vital Signs Type Routine  Temperature Temperature (F) 98.3  Temperature Source oral  Pulse Pulse 101  Respirations Respirations 20  Systolic BP Systolic BP 712  Diastolic BP (mmHg) Diastolic BP (mmHg) 76  Mean BP 94  Pulse Ox % Pulse Ox % 96  Pulse Ox Activity Level  At rest  Oxygen Delivery Room Air/ 21 %   Brief Assessment:  Cardiac Regular   Respiratory clear BS   Gastrointestinal details normal Soft  Bowel sounds normal  No rebound tenderness  mild lower abd distension, tender across the lower abdomen   Lab Results: Hepatic:  26-Nov-14 06:10   Bilirubin, Total 0.6  Bilirubin, Direct 0.2 (Result(s) reported on 17 Apr 2013 at 07:12AM.)  Alkaline Phosphatase 95 (45-117 NOTE: New Reference Range 04/12/13)  SGPT (ALT) 20  SGOT (AST) 17  Total Protein, Serum  5.7  Albumin, Serum  1.8  Routine Micro:  25-Nov-14 02:40   Micro Text Report STOOL COMPREHENSIVE   COMMENT                   NO SALMONELLA OR SHIGELLA ISOLATED   COMMENT                   NO PATHOGENIC E.COLI DETECTED   COMMENT                   NO CAMPYLOBACTER ANTIGEN DETECTED   ANTIBIOTIC                        Comment .3. 100% POLYMORPHONUCLEAR CELLS  Result(s) reported on 16 Apr 2013 at 03:21PM.  Culture Comment NO SALMONELLA OR SHIGELLA ISOLATED  Culture Comment . NO PATHOGENIC E.COLI DETECTED    12:30   Micro Text Report CLOSTRIDIUM DIFFICILE   C.DIFFICILE ANTIGEN       C.DIFFICILE GDH ANTIGEN : NEGATIVE   C.DIFFICILE TOXIN A/B     C.DIFFICILE TOXINS A AND B : NEGATIVE   INTERPRETATION            Negative for C. difficile.    ANTIBIOTIC                        Routine Hem:  26-Nov-14  06:10   WBC (CBC) 5.9  RBC (CBC)  3.46  Hemoglobin (CBC)  11.1  Hematocrit (CBC)  32.1  Platelet Count (CBC) 251  MCV 93  MCH 32.0  MCHC 34.5  RDW 13.0  Neutrophil % 65.5  Lymphocyte % 17.7  Monocyte % 15.8  Eosinophil % 0.4  Basophil % 0.6  Neutrophil # 3.9  Lymphocyte # 1.0  Monocyte # 0.9  Eosinophil # 0.0  Basophil # 0.0 (Result(s) reported on 17 Apr 2013 at [facility].)   Radiology Results: CT:    24-Nov-14 11:52, CT Abdomen and Pelvis With Contrast  CT Abdomen and Pelvis With Contrast   REASON FOR EXAM:    (1) diffuse abd pain, tenderness, fever, worse in   rlq; (2) diffuse abd pain, ten  COMMENTS:   May transport without cardiac monitor    PROCEDURE: CT  - CT ABDOMEN / PELVIS  W  - Apr 15 2013 11:52AM  CLINICAL DATA:  Abdominal discomfort and fever    EXAM:  CT ABDOMEN AND PELVIS WITH CONTRAST    TECHNIQUE:  Multidetector CT imaging of the abdomen and pelvis was performed  using the standard protocol following bolus administration of  intravenous contrast.  CONTRAST:  The patient received 100 cc of Isovue 370 intravenously  and also received oral contrast material.    COMPARISON:  None.    FINDINGS:  The orally administered contrast has traversed a portion of the  small bowel but has not yet reached the terminal ileum. There is no  evidence of a small bowel obstruction. There is a moderate amount of  fluid and gas within the colon. There is subjective mild thickening  of the wall of the distal descending and proximal sigmoid portions  of the colon which may merely reflect incomplete distention. No  focal colitis or diverticulitis is not excluded. There are few  normal sized pericecal lymph nodes demonstrated. There is no  pericecal inflammatory change. The appendix is not discretely  identified. The urinary bladder is moderately distended. The  prostate gland and seminal vesicles appear normal. There is no free  fluid in the abdomen or pelvis. There is  no inguinal nor umbilical  hernia.    The liver exhibits normal density with no focal mass nor ductal  dilation. The gallbladder is adequately distended with no evidence  of stones or wall thickening or pericholecystic fluid. The spleen,  partially distended stomach, pancreas, adrenal glands, and kidneys  exhibit no acute abnormalities. Subcentimeter hypodensities within  the cortex of both kidneys are most compatible with cysts. The  caliber of the abdominal aorta is normal. There is a retro aortic  left renal vein. There is no periaortic or pericaval  lymphadenopathy.  There is minimal compressive atelectasis at the right lung base  posteriorly.     IMPRESSION:  1. There is no evidence of a small or large bowel obstruction. A  focal area of incomplete distention of the distal descending and  proximal sigmoid colon could reflect focal colitis or diverticulitis  in the appropriate clinical setting. There is no evidence of an  abscess nor free fluid or free air.  2. The appendix is not discretely identified. There is no pericecal  inflammatory change. There are a few normal-sized pericecal lymph  nodes demonstrated.  3. There is no acute hepatobiliary abnormality nor acute urinary  tract abnormality.  Electronically Signed    By: David  Martinique    On: 04/15/2013 12:03         Verified By: DAVID A. Martinique, M.D., MD   Assessment/Plan:  Assessment/Plan:  Assessment 1) diverticulitis, improving on ertapenum and flagyl. continue current.  Will need repeat colonoscopy as outpatient in about a month post improvement.   Plan as above, Dr Candace Cruise to see tomorrow.   Electronic Signatures: Loistine Simas (MD)  (Signed [phone] 15:18)  Authored: Chief Complaint, VITAL SIGNS/ANCILLARY NOTES, Brief Assessment, Lab Results, Radiology Results, Assessment/Plan   Last Updated: 27-Nov-14 15:18 by Loistine Simas (MD)

## 2014-11-17 ENCOUNTER — Ambulatory Visit: Payer: Self-pay

## 2015-07-21 ENCOUNTER — Ambulatory Visit: Payer: Managed Care, Other (non HMO) | Attending: Orthopedic Surgery | Admitting: Occupational Therapy

## 2015-07-21 DIAGNOSIS — M25642 Stiffness of left hand, not elsewhere classified: Secondary | ICD-10-CM | POA: Diagnosis present

## 2015-07-21 DIAGNOSIS — M6281 Muscle weakness (generalized): Secondary | ICD-10-CM | POA: Insufficient documentation

## 2015-07-21 DIAGNOSIS — M79642 Pain in left hand: Secondary | ICD-10-CM | POA: Insufficient documentation
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
## 2015-07-21 NOTE — Patient Instructions (Signed)
AROM for thumb in PA and RA  Opposition picking up 2 cm foam block  Stop before pain increase past 2/10   Use moist heat Joint protection and AE education done with pt to implement this week  and fitted CMC neoprene splint to use alternating  With thumb spica - to increase ROM

## 2015-07-28 ENCOUNTER — Ambulatory Visit: Payer: Managed Care, Other (non HMO) | Attending: Orthopedic Surgery | Admitting: Occupational Therapy

## 2015-07-28 DIAGNOSIS — M6281 Muscle weakness (generalized): Secondary | ICD-10-CM

## 2015-07-28 DIAGNOSIS — M25642 Stiffness of left hand, not elsewhere classified: Secondary | ICD-10-CM | POA: Diagnosis present

## 2015-07-28 DIAGNOSIS — M79642 Pain in left hand: Secondary | ICD-10-CM | POA: Diagnosis present

## 2015-07-28 NOTE — Patient Instructions (Signed)
Cont with same HEP - and reinforce again modifications and AE  ( joint protection)

## 2015-07-28 NOTE — Therapy (Signed)
[facility] [address], [place], [address] Phone: [phone]   Fax:  [phone]  Occupational Therapy Treatment  Patient Details  Name: Timothy Hicks MRN: [id number] Date of Birth: 1960/12/20 Referring Provider: Reche Dixon  Encounter Date: 07/28/2015      OT End of Session - 07/28/15 1214    Visit Number 2   Number of Visits 4   Date for OT Re-Evaluation 08/18/15   OT Start Time 1128   OT Stop Time 1207   OT Time Calculation (min) 39 min   Activity Tolerance Patient tolerated treatment well   Behavior During Therapy [facility] for tasks assessed/performed      No past medical history on file.  No past surgical history on file.  There were no vitals filed for this visit.  Visit Diagnosis:  Pain of left hand  Stiffness of finger joint of left hand  Muscle weakness      Subjective Assessment - 07/28/15 1146    Subjective  Pain at the worse was not bad- I am paying attention how I pick up or carry ojbects- pain still at thumb - I just come from PT for my shoulder - alternate  splints    Patient Stated Goals Want to get the pain in my thumb better - to be able to get back to work , ride motor cycle, open jars and packages, cut my steak , push up from floor, do my plumping job, grip objects   Currently in Pain? Yes   Pain Score 2    Pain Location Finger (Comment which one)   Pain Orientation Left   Pain Descriptors / Indicators Aching            [facility] OT Assessment - 07/28/15 0001    Strength   Right Hand Grip (lbs) 51   Right Hand Lateral Pinch 16 lbs   Right Hand 3 Point Pinch 10 lbs   Left Hand Grip (lbs) 50   Left Hand Lateral Pinch 8 lbs   Left Hand 3 Point Pinch 5 lbs   Left Hand AROM   L Thumb Radial ADduction/ABduction 0-55 55   L Thumb Palmar ADduction/ABduction 0-45 50   L Thumb Opposition to Index 5 cm  Opposition to 5th and down to 2nd fold                    OT Treatments/Exercises (OP) - 07/28/15 0001    ADLs   ADL Comments Discuss again joint protection , adapting activities and use of weight and bands at PT for shoulder - pt will ask for cuff weight and do not grip bands with thumbs - AE education done    Hand Exercises   Other Hand Exercises Measurements for thumb AROM and grip/prehension assess   Other Hand Exercises Thumb PA and RA , opposition - 10 reps - compare to R    LUE Paraffin   Number Minutes Paraffin 10 Minutes   LUE Paraffin Location Hand   Comments after measruements to increase ROM and decrease pain    Manual Therapy   Manual therapy comments Soft tissue mobs to webspace and with PA and RA stretch  prior to ROM ther ex                OT Education - 07/28/15 1214    Education provided Yes   Education Details HEP and joint protection    Person(s) Educated Patient  Methods Explanation;Demonstration;Tactile cues;Verbal cues   Comprehension Verbal cues required;Returned demonstration;Verbalized understanding          OT Short Term Goals - 07/21/15 1005    OT SHORT TERM GOAL #1   Title Pain on PRWHE improve with at least 15 points    Baseline PRWHE pain score 37/50   Time 2   Period Weeks   Status New   OT SHORT TERM GOAL #2   Title AROM of L thumb improve to [facility] compare to R with pain less than 2/10    Baseline pain is 4-5/10 during AROM of thumb - see flowsheet   Time 3   Period Weeks   Status New           OT Long Term Goals - 07/21/15 1006    OT LONG TERM GOAL #1   Title Prehension strength in L hand improve with 2-3 lbs to do buttons, cut meat with more ease   Baseline Lat grip R 16, L 6lbs - 3 point grip R 10 , L 2 lbs    Time 4   Period Weeks   Status New   OT LONG TERM GOAL #2   Title Pt verbalize 3 modifications and/or AE to implement to decrease pain and increase independency    Baseline very little knowledge    Time 4   Period Weeks   Status New               Plan -  07/28/15 1215    Clinical Impression Statement Pt AROM of thumb and grip /prehension strength  improve greatly , pain some - pt making changes in using hand with joint protetion , modifications and AE - pt to follow up in 10 days    Pt will benefit from skilled therapeutic intervention in order to improve on the following deficits (Retired) Impaired flexibility;Decreased range of motion;Decreased knowledge of precautions;Decreased knowledge of use of DME;Impaired UE functional use;Pain;Decreased strength   Rehab Potential Good   OT Frequency 1x / week   OT Duration 2 weeks   Plan assess pain and ROM /strength - reinforce joint protection    OT Home Exercise Plan see pt instruction    Consulted and Agree with Plan of Care Patient        Problem List There are no active problems to display for this patient.   Rosalyn Gess OTR/L,CLT  07/28/2015, 12:19 PM  [facility] [address], [place], [address] Phone: [phone]   Fax:  [phone]  Name: Timothy Hicks MRN: [id number] Date of Birth: [birth year]

## 2015-08-06 ENCOUNTER — Ambulatory Visit: Payer: Managed Care, Other (non HMO) | Admitting: Occupational Therapy

## 2015-08-06 ENCOUNTER — Encounter: Payer: Self-pay | Admitting: Occupational Therapy

## 2015-08-06 DIAGNOSIS — M79642 Pain in left hand: Secondary | ICD-10-CM | POA: Diagnosis not present

## 2015-08-06 DIAGNOSIS — M25642 Stiffness of left hand, not elsewhere classified: Secondary | ICD-10-CM

## 2015-08-06 DIAGNOSIS — M6281 Muscle weakness (generalized): Secondary | ICD-10-CM

## 2015-08-06 NOTE — Patient Instructions (Signed)
Pt was educated to wear thumb spica neoprene splint PRN for joint support/protection during functional activities. Also reviewed a/e and joint protection techniques.

## 2015-08-06 NOTE — Therapy (Signed)
[facility] [address], [place], [address] Phone: [phone]   Fax:  [phone]  Occupational Therapy Treatment  Patient Details  Name: Timothy Hicks MRN: [id number] Date of Birth: 01-03-1961 Referring Provider: Reche Dixon  Encounter Date: 08/06/2015      OT End of Session - 08/06/15 1107    Visit Number 3   Number of Visits 4   Date for OT Re-Evaluation 08/18/15   OT Start Time 1027   OT Stop Time 1108   OT Time Calculation (min) 41 min   Activity Tolerance Patient tolerated treatment well   Behavior During Therapy [facility] for tasks assessed/performed      History reviewed. No pertinent past medical history.  No past surgical history on file.  There were no vitals filed for this visit.  Visit Diagnosis:  Pain of left hand  Stiffness of finger joint of left hand  Muscle weakness      Subjective Assessment - 08/06/15 1034    Subjective  Pain is worse when it's cold out. Pt reports that he is mindful of positioning of L hand during functional activitiy especially when picking up and carrying objects. Pain left thumb 2/10. Pt wears neoproene thumb spica splint off and on durng the day PRN.   Patient Stated Goals Want to get the pain in my thumb better - to be able to get back to work , ride motor cycle, open jars and packages, cut my steak , push up from floor, do my plumping job, grip objects   Currently in Pain? Yes   Pain Score 2    Pain Location Finger (Comment which one)   Pain Orientation Left   Pain Descriptors / Indicators Aching   Pain Type Chronic pain   Pain Relieving Factors Moist heat helps   Multiple Pain Sites No      Pt Instructions: Pt was educated to wear thumb spica neoprene splint PRN for joint support/protection during functional activities. Also reviewed a/e and joint protection techniques.      [facility] OT Assessment - 08/06/15 0001    Strength   Right Hand Grip  (lbs) 60   Right Hand Lateral Pinch 16 lbs   Right Hand 3 Point Pinch 12 lbs   Left Hand Grip (lbs) 50   Left Hand Lateral Pinch 12 lbs   Left Hand 3 Point Pinch 8.5 lbs   Left Hand AROM   L Thumb MCP 0-60 55 Degrees   L Thumb IP 0-80 80 Degrees   L Thumb Radial ADduction/ABduction 0-55 55   L Thumb Palmar ADduction/ABduction 0-45 50   L Thumb Opposition to Index 5 cm  base of fifth finger just distal to MCP area                  OT Treatments/Exercises (OP) - 08/06/15 0001    ADLs   ADL Comments Reviewed joint protection techniques.and discussed implementing during daily and work activities   Airline pilot Exercises Measurements for thumb AROM and grip/prehension assess   Other Hand Exercises Thumb PA and RA , opposition - 10 reps - compare to R    LUE Paraffin   Number Minutes Paraffin 10 Minutes   LUE Paraffin Location Hand   Comments To assist in increasing AROM and decreasing pain L thumb/CMC   Manual Therapy   Manual therapy comments Soft tissue mobs to webspace and with PA and RA stretch  prior to ROM ther ex                OT Education - 08/06/15 1105    Education provided Yes   Education Details HEP and joint protection    Person(s) Educated Patient   Methods Explanation;Demonstration;Tactile cues;Verbal cues   Comprehension Verbalized understanding;Returned demonstration;Verbal cues required          OT Short Term Goals - 07/21/15 1005    OT SHORT TERM GOAL #1   Title Pain on PRWHE improve with at least 15 points    Baseline PRWHE pain score 37/50   Time 2   Period Weeks   Status New   OT SHORT TERM GOAL #2   Title AROM of L thumb improve to [facility] compare to R with pain less than 2/10    Baseline pain is 4-5/10 during AROM of thumb - see flowsheet   Time 3   Period Weeks   Status New           OT Long Term Goals - 07/21/15 1006    OT LONG TERM GOAL #1   Title Prehension strength in L hand improve with 2-3 lbs to do  buttons, cut meat with more ease   Baseline Lat grip R 16, L 6lbs - 3 point grip R 10 , L 2 lbs    Time 4   Period Weeks   Status New   OT LONG TERM GOAL #2   Title Pt verbalize 3 modifications and/or AE to implement to decrease pain and increase independency    Baseline very little knowledge    Time 4   Period Weeks   Status New               Plan - 08/06/15 1109    Clinical Impression Statement AROM left thumb and grip/pinch continues to improve. Discussed joint protection techniques and a/e as well as modifications to tools etc. Pt has eleceted to f/u in 10 days for final reassessment and anticipated d/c.   Pt will benefit from skilled therapeutic intervention in order to improve on the following deficits (Retired) Impaired flexibility;Decreased range of motion;Decreased knowledge of precautions;Decreased knowledge of use of DME;Impaired UE functional use;Pain;Decreased strength   Rehab Potential Good   OT Frequency 1x / week   OT Duration 2 weeks   Plan Anticipate D/C. Assess pain, ROM/strength. Pt to ask questions re: specific work "tools" for joint protection recommendations. Assess goals.   OT Home Exercise Plan see pt instruction    Consulted and Agree with Plan of Care Patient        Problem List There are no active problems to display for this patient.   Carlynn Herald, [place] Rabenold Beth Dixon, OTR/L 08/06/2015, 11:13 AM  [facility] [address] [place], [address] Phone: [phone]   Fax:  [phone]  Name: ETHELBERT ONDERKO MRN: [id number] Date of Birth: 1960-09-01

## 2015-08-18 ENCOUNTER — Ambulatory Visit: Payer: Managed Care, Other (non HMO) | Admitting: Occupational Therapy

## 2015-08-18 DIAGNOSIS — M79642 Pain in left hand: Secondary | ICD-10-CM | POA: Diagnosis not present

## 2015-08-18 DIAGNOSIS — M25642 Stiffness of left hand, not elsewhere classified: Secondary | ICD-10-CM

## 2015-08-18 DIAGNOSIS — M6281 Muscle weakness (generalized): Secondary | ICD-10-CM

## 2015-08-18 NOTE — Therapy (Signed)
[facility] [address], [place], [address] Phone: [phone]   Fax:  [phone]  Occupational Therapy Treatment  Patient Details  Name: Timothy Hicks MRN: [id number] Date of Birth: [birth year] Referring Provider: Reche Dixon  Encounter Date: 08/18/2015      OT End of Session - 08/18/15 1222    Visit Number 4   Number of Visits 4   Date for OT Re-Evaluation 08/18/15   OT Start Time 1130   OT Stop Time 1215   OT Time Calculation (min) 45 min   Activity Tolerance Patient tolerated treatment well   Behavior During Therapy [facility] for tasks assessed/performed      No past medical history on file.  No past surgical history on file.  There were no vitals filed for this visit.  Visit Diagnosis:  Pain of left hand  Stiffness of finger joint of left hand  Muscle weakness      Subjective Assessment - 08/18/15 1214    Subjective  My thumb is much better than when we started - but I was working on something and it slipped and I pulled /hit part of my thumb - feels little tight- and little more pain but  much better still    Patient Stated Goals Want to get the pain in my thumb better - to be able to get back to work , ride motor cycle, open jars and packages, cut my steak , push up from floor, do my plumping job, grip objects   Currently in Pain? Yes   Pain Score 1    Pain Location Finger (Comment which one)   Pain Orientation Left   Pain Descriptors / Indicators Aching   Pain Type Chronic pain            [facility] OT Assessment - 08/18/15 0001    Strength   Right Hand Grip (lbs) 60   Right Hand Lateral Pinch 16 lbs   Right Hand 3 Point Pinch 12 lbs   Left Hand Grip (lbs) 50   Left Hand Lateral Pinch 12 lbs   Left Hand 3 Point Pinch 8.5 lbs   Left Hand AROM   L Thumb MCP 0-60 55 Degrees   L Thumb IP 0-80 80 Degrees   L Thumb Radial ADduction/ABduction 0-55 55   L Thumb Palmar  ADduction/ABduction 0-45 50        Parafin to L hand at [facility] to decrease pain at thumb - 10 min  Pt report he is getting one the the next 2 wks   AAROM of thumb PA and RA on L  Opposition - talked about change in thumb and to adjust way pick up small objects  Grip and prehension improved greatly from [facility] and ROM at thumb on L  Reviewed again joint protection  [place] at 3.3MHz, 20% and 4 min over [place] at end to decrease pain - this feels much better - I never had it done that  PRWHE for pain improve to 5/50 Function 2.5/50                   OT Education - 08/18/15 1221    Education provided Yes   Education Details Joint protection and discharge instruction   Person(s) Educated Patient   Methods Explanation;Demonstration;Tactile cues;Verbal cues   Comprehension Verbal cues required;Returned demonstration;Verbalized understanding          OT Short Term Goals - 08/18/15 1246  OT SHORT TERM GOAL #1   Title Pain on PRWHE improve with at least 15 points    Baseline pain improve to 5/50 from 37/50   Status Achieved   OT SHORT TERM GOAL #2   Title AROM of L thumb improve to [facility] compare to R with pain less than 2/10    Status Achieved           OT Long Term Goals - 08/18/15 1246    OT LONG TERM GOAL #1   Title Prehension strength in L hand improve with 2-3 lbs to do buttons, cut meat with more ease   Status Achieved   OT LONG TERM GOAL #2   Title Pt verbalize 3 modifications and/or AE to implement to decrease pain and increase independency    Status Achieved               Plan - 08/18/15 1244    Clinical Impression Statement Pt made great progress in ROM at thumb , pain , functional use and grip /prehension - Function improved from 31/50 to 2.5/50; and  pain improve 37/50 to 5/50-  pt met all goals - pt had neoprene splint to use - and knowledge of HEP and  joint protection    OT Treatment/Interventions Self-care/ADL training;Therapeutic  exercise;Patient/family education;Manual Therapy;Parrafin   Plan Discharge   OT Home Exercise Plan see pt instruction    Consulted and Agree with Plan of Care Patient        Problem List There are no active problems to display for this patient.   Rosalyn Gess OTR/L,CLT  08/18/2015, 12:48 PM  [facility] [address] [place], [address] Phone: [phone]   Fax:  [phone]  Name: Timothy Hicks MRN: [id number] Date of Birth: 1961/01/24

## 2015-09-08 ENCOUNTER — Other Ambulatory Visit
Admission: RE | Admit: 2015-09-08 | Discharge: 2015-09-08 | Disposition: A | Discharge status: Home / Self Care | Payer: Managed Care, Other (non HMO) | Source: Ambulatory Visit | Attending: Gastroenterology | Admitting: Gastroenterology

## 2015-09-08 DIAGNOSIS — K529 Noninfective gastroenteritis and colitis, unspecified: Secondary | ICD-10-CM | POA: Insufficient documentation

## 2015-09-08 LAB — GASTROINTESTINAL PANEL BY PCR, STOOL (REPLACES STOOL CULTURE)
ADENOVIRUS F40/41: NOT DETECTED
Astrovirus: NOT DETECTED
CRYPTOSPORIDIUM: NOT DETECTED
CYCLOSPORA CAYETANENSIS: NOT DETECTED
Campylobacter species: NOT DETECTED
E. COLI O157: NOT DETECTED
Entamoeba histolytica: NOT DETECTED
Enteroaggregative E coli (EAEC): NOT DETECTED
Enteropathogenic E coli (EPEC): NOT DETECTED
Enterotoxigenic E coli (ETEC): NOT DETECTED
Giardia lamblia: NOT DETECTED
Norovirus GI/GII: NOT DETECTED
Plesimonas shigelloides: NOT DETECTED
Rotavirus A: NOT DETECTED
SAPOVIRUS (I, II, IV, AND V): NOT DETECTED
Salmonella species: NOT DETECTED
Shiga like toxin producing E coli (STEC): NOT DETECTED
Shigella/Enteroinvasive E coli (EIEC): NOT DETECTED
VIBRIO CHOLERAE: NOT DETECTED
VIBRIO SPECIES: NOT DETECTED
YERSINIA ENTEROCOLITICA: NOT DETECTED

## 2015-09-17 ENCOUNTER — Other Ambulatory Visit
Admission: RE | Admit: 2015-09-17 | Discharge: 2015-09-17 | Disposition: A | Discharge status: Home / Self Care | Payer: Managed Care, Other (non HMO) | Source: Skilled Nursing Facility | Attending: Gastroenterology | Admitting: Gastroenterology

## 2015-09-17 DIAGNOSIS — R197 Diarrhea, unspecified: Secondary | ICD-10-CM | POA: Insufficient documentation

## 2015-09-17 LAB — C DIFFICILE QUICK SCREEN W PCR REFLEX
C Diff antigen: NEGATIVE
C Diff interpretation: NEGATIVE
C Diff toxin: NEGATIVE

## 2015-12-10 ENCOUNTER — Encounter: Payer: Self-pay | Admitting: *Deleted

## 2015-12-11 ENCOUNTER — Ambulatory Visit
Admission: RE | Admit: 2015-12-11 | Discharge: 2015-12-11 | Disposition: A | Discharge status: Home / Self Care | Payer: Managed Care, Other (non HMO) | Source: Ambulatory Visit | Attending: Gastroenterology | Admitting: Gastroenterology

## 2015-12-11 ENCOUNTER — Encounter
Admission: RE | Disposition: A | Discharge status: Home / Self Care | Payer: Self-pay | Source: Ambulatory Visit | Attending: Gastroenterology

## 2015-12-11 ENCOUNTER — Ambulatory Visit: Payer: Managed Care, Other (non HMO) | Admitting: Anesthesiology

## 2015-12-11 ENCOUNTER — Other Ambulatory Visit: Payer: Self-pay | Admitting: Gastroenterology

## 2015-12-11 DIAGNOSIS — K2970 Gastritis, unspecified, without bleeding: Secondary | ICD-10-CM | POA: Diagnosis not present

## 2015-12-11 DIAGNOSIS — R1030 Lower abdominal pain, unspecified: Secondary | ICD-10-CM | POA: Insufficient documentation

## 2015-12-11 DIAGNOSIS — Z87891 Personal history of nicotine dependence: Secondary | ICD-10-CM | POA: Diagnosis not present

## 2015-12-11 DIAGNOSIS — Z794 Long term (current) use of insulin: Secondary | ICD-10-CM | POA: Diagnosis not present

## 2015-12-11 DIAGNOSIS — E119 Type 2 diabetes mellitus without complications: Secondary | ICD-10-CM | POA: Diagnosis not present

## 2015-12-11 DIAGNOSIS — R634 Abnormal weight loss: Secondary | ICD-10-CM | POA: Diagnosis not present

## 2015-12-11 DIAGNOSIS — K5090 Crohn's disease, unspecified, without complications: Secondary | ICD-10-CM | POA: Insufficient documentation

## 2015-12-11 DIAGNOSIS — M109 Gout, unspecified: Secondary | ICD-10-CM | POA: Insufficient documentation

## 2015-12-11 DIAGNOSIS — Z8711 Personal history of peptic ulcer disease: Secondary | ICD-10-CM | POA: Diagnosis not present

## 2015-12-11 DIAGNOSIS — K50919 Crohn's disease, unspecified, with unspecified complications: Secondary | ICD-10-CM

## 2015-12-11 DIAGNOSIS — Z79899 Other long term (current) drug therapy: Secondary | ICD-10-CM | POA: Diagnosis not present

## 2015-12-11 DIAGNOSIS — E612 Magnesium deficiency: Secondary | ICD-10-CM | POA: Diagnosis not present

## 2015-12-11 HISTORY — DX: Ulcerative colitis, unspecified, without complications: K51.90

## 2015-12-11 HISTORY — DX: Type 2 diabetes mellitus without complications: E11.9

## 2015-12-11 HISTORY — PX: ESOPHAGOGASTRODUODENOSCOPY (EGD) WITH PROPOFOL: SHX5813

## 2015-12-11 HISTORY — PX: COLONOSCOPY WITH PROPOFOL: SHX5780

## 2015-12-11 HISTORY — DX: Magnesium deficiency: E61.2

## 2015-12-11 HISTORY — DX: Gout, unspecified: M10.9

## 2015-12-11 LAB — GLUCOSE, CAPILLARY: Glucose-Capillary: 114 mg/dL — ABNORMAL HIGH (ref 65–99)

## 2015-12-11 SURGERY — COLONOSCOPY WITH PROPOFOL
Anesthesia: General

## 2015-12-11 MED ORDER — PROPOFOL 10 MG/ML IV BOLUS
INTRAVENOUS | Status: DC | PRN
  Administered 2015-12-11: 100 mg via INTRAVENOUS

## 2015-12-11 MED ORDER — SODIUM CHLORIDE 0.9 % IV SOLN
INTRAVENOUS | Status: DC
  Administered 2015-12-11: 07:00:00 via INTRAVENOUS
  Administered 2015-12-11: 1000 mL via INTRAVENOUS

## 2015-12-11 MED ORDER — SODIUM CHLORIDE 0.9 % IV SOLN: INTRAVENOUS | Status: DC

## 2015-12-11 MED ORDER — PROPOFOL 500 MG/50ML IV EMUL
INTRAVENOUS | Status: DC | PRN
  Administered 2015-12-11: 150 ug/kg/min via INTRAVENOUS

## 2015-12-11 MED ORDER — MIDAZOLAM HCL 5 MG/5ML IJ SOLN
INTRAMUSCULAR | Status: DC | PRN
  Administered 2015-12-11: 1 mg via INTRAVENOUS

## 2015-12-11 MED ORDER — FENTANYL CITRATE (PF) 100 MCG/2ML IJ SOLN
INTRAMUSCULAR | Status: DC | PRN
  Administered 2015-12-11: 50 ug via INTRAVENOUS

## 2015-12-11 MED ORDER — LIDOCAINE 2% (20 MG/ML) 5 ML SYRINGE
INTRAMUSCULAR | Status: DC | PRN
  Administered 2015-12-11: 40 mg via INTRAVENOUS

## 2015-12-11 NOTE — Anesthesia Preprocedure Evaluation (Addendum)
Anesthesia Evaluation  Patient identified by MRN, date of birth, ID band Patient awake    Reviewed: Allergy & Precautions, NPO status , Patient's Chart, lab work & pertinent test results, reviewed documented beta blocker date and time   Airway Mallampati: II  TM Distance: >3 FB     Dental  (+) Chipped   Pulmonary former smoker,           Cardiovascular      Neuro/Psych    GI/Hepatic PUD,   Endo/Other  diabetes, Type 2  Renal/GU      Musculoskeletal   Abdominal   Peds  Hematology   Anesthesia Other Findings Gout and UC. UC being treated.  Reproductive/Obstetrics                            Anesthesia Physical Anesthesia Plan  ASA: III  Anesthesia Plan: General   Post-op Pain Management:    Induction: Intravenous  Airway Management Planned: Nasal Cannula  Additional Equipment:   Intra-op Plan:   Post-operative Plan:   Informed Consent: I have reviewed the patients History and Physical, chart, labs and discussed the procedure including the risks, benefits and alternatives for the proposed anesthesia with the patient or authorized representative who has indicated his/her understanding and acceptance.     Plan Discussed with: CRNA  Anesthesia Plan Comments:         Anesthesia Quick Evaluation

## 2015-12-11 NOTE — Op Note (Addendum)
[facility] Patient Name: Timothy Hicks Procedure Date: 12/11/2015 7:45 AM MRN: [id number] Account #: [id number] Date of Birth: 26-Feb-1961 Admit Type: Outpatient Age: 55 Room: [facility] ENDO ROOM 4 Gender: Male Note Status: Supervisor Override Procedure:            Colonoscopy Indications:          Lower abdominal pain, Chronic diarrhea, Personal                        history of Crohn's disease Providers:            [name], MD Referring MD:         Youlanda Roys. Lovie Macadamia, MD (Referring MD) Medicines:            Monitored Anesthesia Care Complications:        No immediate complications. Procedure:            Pre-Anesthesia Assessment:                       - ASA Grade Assessment: III - A patient with severe                        systemic disease.                       After obtaining informed consent, the colonoscope was                        passed under direct vision. Throughout the procedure,                        the patient's blood pressure, pulse, and oxygen                        saturations were monitored continuously. The                        Colonoscope was introduced through the anus and                        advanced to the the terminal ileum. The quality of the                        bowel preparation was good. Findings:      Inflammation characterized by congestion (edema), erosions,       pseudopolyps, serpentine ulcerations and shallow ulcerations was found       as patches surrounded by normal mucosa in the sigmoid colon, in the       descending colon, in the transverse colon, in the hepatic flexure, in       the ascending colon and at the cecum. The rectum, the distal sigmoid       colon and the mid descending colon were spared. This was moderate to       severe., and when compared to previous examinations, the findings are       worsened.      The terminal ileum appeared normal. Biopsies were taken with a cold    forceps for histology.      The digital rectal exam was normal. Impression:           - Crohn's disease. Inflammation was  found in the                        sigmoid colon, in the descending colon, in the                        transverse colon, in the hepatic flexure, in the                        ascending colon and at the cecum. This was moderate to                        severe. The findings are worsened compared to previous                        examinations.                       - The examined portion of the ileum was normal.                        Biopsied. Recommendation:       - Discharge patient to home.                       - Perform a small bowel follow through at appointment                        to be scheduled.                       - Continue present medications.                       - Await pathology results.                       - Check [facility] IBD panel today.                       - Return to GI clinic in 1 week. Procedure Code(s):    --- Professional ---                       [phone], Colonoscopy, flexible; with biopsy, single or                        multiple Diagnosis Code(s):    --- Professional ---                       K50.90, Crohn's disease, unspecified, without                        complications                       R10.30, Lower abdominal pain, unspecified                       K52.9, Noninfective gastroenteritis and colitis,                        unspecified  Z87.19, Personal history of other diseases of the                        digestive system CPT copyright 2016 American Medical Association. All rights reserved. The codes documented in this report are preliminary and upon coder review may  be revised to meet current compliance requirements. [name], MD 12/11/2015 8:55:49 AM This report has been signed electronically. Number of Addenda: 0 Note Initiated On: 12/11/2015 7:45 AM Scope Withdrawal Time: 0 hours  24 minutes 4 seconds  Total Procedure Duration: 0 hours 30 minutes 41 seconds       [facility]

## 2015-12-11 NOTE — Anesthesia Postprocedure Evaluation (Signed)
Anesthesia Post Note  Patient: Timothy Hicks  Procedure(s) Performed: Procedure(s) (LRB): COLONOSCOPY WITH PROPOFOL (N/A) ESOPHAGOGASTRODUODENOSCOPY (EGD) WITH PROPOFOL (N/A)  Patient location during evaluation: Endoscopy Anesthesia Type: General Level of consciousness: awake and alert Pain management: pain level controlled Vital Signs Assessment: post-procedure vital signs reviewed and stable Respiratory status: spontaneous breathing, nonlabored ventilation, respiratory function stable and patient connected to nasal cannula oxygen Cardiovascular status: blood pressure returned to baseline and stable Postop Assessment: no signs of nausea or vomiting Anesthetic complications: no    Last Vitals:  Filed Vitals:   12/11/15 0857 12/11/15 0915  BP:  138/96  Pulse: 80   Temp: 36.1 C   Resp: 18     Last Pain: There were no vitals filed for this visit.               Rylee Nuzum S

## 2015-12-11 NOTE — Op Note (Signed)
[facility] Gastroenterology Patient Name: Timothy Hicks Procedure Date: 12/11/2015 7:46 AM MRN: [id number] Account #: [id number] Date of Birth: February 01, 1961 Admit Type: Outpatient Age: 55 Room: [facility] ENDO ROOM 4 Gender: Male Note Status: Finalized Procedure:            Upper GI endoscopy Indications:          Lower abdominal pain, Weight loss Providers:            [name], MD Referring MD:         Youlanda Roys. Lovie Macadamia, MD (Referring MD) Medicines:            Monitored Anesthesia Care Complications:        No immediate complications. Procedure:            Pre-Anesthesia Assessment:                       - ASA Grade Assessment: III - A patient with severe                        systemic disease.                       After obtaining informed consent, the endoscope was                        passed under direct vision. Throughout the procedure,                        the patient's blood pressure, pulse, and oxygen                        saturations were monitored continuously. The Endoscope                        was introduced through the mouth, and advanced to the                        third part of duodenum. The upper GI endoscopy was                        accomplished without difficulty. The patient tolerated                        the procedure well. Findings:      The Z-line was irregular. Biopsies were taken with a cold forceps for       histology.      The exam of the esophagus was otherwise normal.      Diffuse mild inflammation characterized by erythema was found in the       gastric body. Biopsies were taken with a cold forceps for histology.      Diffuse mild mucosal variance characterized by altered texture was found       in the entire duodenum. Biopsies were taken with a cold forceps for       histology.      Discoloration of the base of the tongue. Impression:           - Z-line irregular. Biopsied.                       - Gastritis.  Biopsied.                       -  Mucosal variant in the duodenum. Biopsied.                       -discoloration of the base of the tongue Recommendation:       - Await pathology results.                       - Return to GI clinic in 3 weeks.                       - Refer to an ENT specialist at appointment to be                        scheduled. Procedure Code(s):    --- Professional ---                       [phone], Esophagogastroduodenoscopy, flexible, transoral;                        with biopsy, single or multiple Diagnosis Code(s):    --- Professional ---                       K22.8, Other specified diseases of esophagus                       K29.70, Gastritis, unspecified, without bleeding                       K31.89, Other diseases of stomach and duodenum                       R10.30, Lower abdominal pain, unspecified                       R63.4, Abnormal weight loss CPT copyright 2016 American Medical Association. All rights reserved. The codes documented in this report are preliminary and upon coder review may  be revised to meet current compliance requirements. [name], MD 12/11/2015 8:11:39 AM This report has been signed electronically. Number of Addenda: 0 Note Initiated On: 12/11/2015 7:46 AM      [facility]

## 2015-12-11 NOTE — Transfer of Care (Signed)
Immediate Anesthesia Transfer of Care Note  Patient: Timothy Hicks  Procedure(s) Performed: Procedure(s): COLONOSCOPY WITH PROPOFOL (N/A) ESOPHAGOGASTRODUODENOSCOPY (EGD) WITH PROPOFOL (N/A)  Patient Location: PACU and Endoscopy Unit  Anesthesia Type:General  Level of Consciousness: sedated  Airway & Oxygen Therapy: Patient Spontanous Breathing and Patient connected to nasal cannula oxygen  Post-op Assessment: Report given to RN and Post -op Vital signs reviewed and stable  Post vital signs: Reviewed and stable  Last Vitals:  Filed Vitals:   12/11/15 0701 12/11/15 0854  BP: 147/86 107/82  Pulse: 98 82  Temp: 36.7 C 36.1 C  Resp: 20 19    Last Pain: There were no vitals filed for this visit.       Complications: No apparent anesthesia complications
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
## 2015-12-14 ENCOUNTER — Encounter: Payer: Self-pay | Admitting: Gastroenterology

## 2015-12-14 LAB — SURGICAL PATHOLOGY

## 2015-12-15 ENCOUNTER — Ambulatory Visit
Admission: RE | Admit: 2015-12-15 | Discharge: 2015-12-15 | Disposition: A | Discharge status: Home / Self Care | Payer: Managed Care, Other (non HMO) | Source: Ambulatory Visit | Attending: Gastroenterology | Admitting: Gastroenterology

## 2015-12-15 DIAGNOSIS — K50919 Crohn's disease, unspecified, with unspecified complications: Secondary | ICD-10-CM

## 2015-12-23 ENCOUNTER — Other Ambulatory Visit
Admission: RE | Admit: 2015-12-23 | Discharge: 2015-12-23 | Disposition: A | Discharge status: Home / Self Care | Payer: Managed Care, Other (non HMO) | Source: Ambulatory Visit | Attending: Gastroenterology | Admitting: Gastroenterology

## 2015-12-23 ENCOUNTER — Ambulatory Visit
Admission: RE | Admit: 2015-12-23 | Discharge: 2015-12-23 | Disposition: A | Discharge status: Home / Self Care | Payer: Managed Care, Other (non HMO) | Source: Ambulatory Visit | Attending: Gastroenterology | Admitting: Gastroenterology

## 2015-12-23 DIAGNOSIS — K50919 Crohn's disease, unspecified, with unspecified complications: Secondary | ICD-10-CM | POA: Diagnosis present

## 2015-12-23 DIAGNOSIS — K6389 Other specified diseases of intestine: Secondary | ICD-10-CM | POA: Insufficient documentation

## 2015-12-23 LAB — C DIFFICILE QUICK SCREEN W PCR REFLEX
C DIFFICLE (CDIFF) ANTIGEN: NEGATIVE
C Diff interpretation: NOT DETECTED
C Diff toxin: NEGATIVE

## 2015-12-25 ENCOUNTER — Encounter: Payer: Self-pay | Admitting: Gastroenterology

## 2015-12-25 LAB — MISCELLANEOUS TEST: Miscellaneous Test: 1800

## 2016-02-10 ENCOUNTER — Other Ambulatory Visit
Admission: RE | Admit: 2016-02-10 | Discharge: 2016-02-10 | Disposition: A | Discharge status: Home / Self Care | Payer: Managed Care, Other (non HMO) | Source: Other Acute Inpatient Hospital | Attending: Gastroenterology | Admitting: Gastroenterology

## 2016-02-10 DIAGNOSIS — R197 Diarrhea, unspecified: Secondary | ICD-10-CM | POA: Insufficient documentation

## 2016-02-10 LAB — C DIFFICILE QUICK SCREEN W PCR REFLEX
C DIFFICILE (CDIFF) INTERP: NOT DETECTED
C DIFFICILE (CDIFF) TOXIN: NEGATIVE
C DIFFICLE (CDIFF) ANTIGEN: NEGATIVE

## 2016-02-16 ENCOUNTER — Other Ambulatory Visit: Payer: Self-pay | Admitting: Family Medicine

## 2016-02-19 ENCOUNTER — Other Ambulatory Visit: Payer: Self-pay | Admitting: Family Medicine

## 2016-02-25 ENCOUNTER — Other Ambulatory Visit: Payer: Self-pay | Admitting: Family Medicine

## 2016-04-18 ENCOUNTER — Other Ambulatory Visit: Payer: Self-pay | Admitting: Family Medicine

## 2016-04-21 ENCOUNTER — Other Ambulatory Visit: Payer: Self-pay | Admitting: Family Medicine

## 2016-07-07 ENCOUNTER — Other Ambulatory Visit: Payer: Self-pay | Admitting: Family Medicine

## 2016-11-10 ENCOUNTER — Encounter: Payer: Self-pay | Admitting: *Deleted

## 2016-11-11 ENCOUNTER — Encounter
Admission: RE | Disposition: A | Discharge status: Home / Self Care | Payer: Self-pay | Source: Ambulatory Visit | Attending: Gastroenterology

## 2016-11-11 ENCOUNTER — Encounter: Payer: Self-pay | Admitting: *Deleted

## 2016-11-11 ENCOUNTER — Ambulatory Visit
Admission: RE | Admit: 2016-11-11 | Discharge: 2016-11-11 | Disposition: A | Discharge status: Home / Self Care | Payer: Managed Care, Other (non HMO) | Source: Ambulatory Visit | Attending: Gastroenterology | Admitting: Gastroenterology

## 2016-11-11 ENCOUNTER — Ambulatory Visit: Payer: Managed Care, Other (non HMO) | Admitting: Anesthesiology

## 2016-11-11 DIAGNOSIS — Z794 Long term (current) use of insulin: Secondary | ICD-10-CM | POA: Insufficient documentation

## 2016-11-11 DIAGNOSIS — K219 Gastro-esophageal reflux disease without esophagitis: Secondary | ICD-10-CM | POA: Insufficient documentation

## 2016-11-11 DIAGNOSIS — K5100 Ulcerative (chronic) pancolitis without complications: Secondary | ICD-10-CM | POA: Insufficient documentation

## 2016-11-11 DIAGNOSIS — M109 Gout, unspecified: Secondary | ICD-10-CM | POA: Insufficient documentation

## 2016-11-11 DIAGNOSIS — E119 Type 2 diabetes mellitus without complications: Secondary | ICD-10-CM | POA: Diagnosis not present

## 2016-11-11 DIAGNOSIS — Z87891 Personal history of nicotine dependence: Secondary | ICD-10-CM | POA: Diagnosis not present

## 2016-11-11 DIAGNOSIS — K5730 Diverticulosis of large intestine without perforation or abscess without bleeding: Secondary | ICD-10-CM | POA: Diagnosis not present

## 2016-11-11 DIAGNOSIS — Z1211 Encounter for screening for malignant neoplasm of colon: Secondary | ICD-10-CM | POA: Diagnosis not present

## 2016-11-11 DIAGNOSIS — K5090 Crohn's disease, unspecified, without complications: Secondary | ICD-10-CM | POA: Diagnosis not present

## 2016-11-11 HISTORY — DX: Gastro-esophageal reflux disease with esophagitis, without bleeding: K21.00

## 2016-11-11 HISTORY — PX: COLONOSCOPY WITH PROPOFOL: SHX5780

## 2016-11-11 HISTORY — DX: Gastro-esophageal reflux disease with esophagitis: K21.0

## 2016-11-11 HISTORY — DX: Crohn's disease, unspecified, without complications: K50.90

## 2016-11-11 LAB — PROTIME-INR
INR: 1.01
PROTHROMBIN TIME: 13.3 s (ref 11.4–15.2)

## 2016-11-11 LAB — CBC WITH DIFFERENTIAL/PLATELET
BASOS PCT: 1 %
Basophils Absolute: 0.1 10*3/uL (ref 0–0.1)
EOS ABS: 0.2 10*3/uL (ref 0–0.7)
Eosinophils Relative: 2 %
HEMATOCRIT: 49.4 % (ref 40.0–52.0)
HEMOGLOBIN: 16.6 g/dL (ref 13.0–18.0)
LYMPHS ABS: 3.2 10*3/uL (ref 1.0–3.6)
Lymphocytes Relative: 28 %
MCH: 33.1 pg (ref 26.0–34.0)
MCHC: 33.7 g/dL (ref 32.0–36.0)
MCV: 98.2 fL (ref 80.0–100.0)
MONOS PCT: 7 %
Monocytes Absolute: 0.7 10*3/uL (ref 0.2–1.0)
NEUTROS ABS: 7 10*3/uL — AB (ref 1.4–6.5)
NEUTROS PCT: 62 %
Platelets: 202 10*3/uL (ref 150–440)
RBC: 5.03 MIL/uL (ref 4.40–5.90)
RDW: 13.6 % (ref 11.5–14.5)
WBC: 11.2 10*3/uL — ABNORMAL HIGH (ref 3.8–10.6)

## 2016-11-11 LAB — GLUCOSE, CAPILLARY: Glucose-Capillary: 182 mg/dL — ABNORMAL HIGH (ref 65–99)

## 2016-11-11 SURGERY — COLONOSCOPY WITH PROPOFOL
Anesthesia: General

## 2016-11-11 MED ORDER — MIDAZOLAM HCL 5 MG/5ML IJ SOLN
INTRAMUSCULAR | Status: DC | PRN
  Administered 2016-11-11: 2 mg via INTRAVENOUS

## 2016-11-11 MED ORDER — SODIUM CHLORIDE 0.9 % IV SOLN: INTRAVENOUS | Status: DC

## 2016-11-11 MED ORDER — GLYCOPYRROLATE 0.2 MG/ML IJ SOLN
INTRAMUSCULAR | Status: AC
  Filled 2016-11-11: qty 1

## 2016-11-11 MED ORDER — PROPOFOL 500 MG/50ML IV EMUL
INTRAVENOUS | Status: DC | PRN
  Administered 2016-11-11: 120 ug/kg/min via INTRAVENOUS

## 2016-11-11 MED ORDER — MIDAZOLAM HCL 2 MG/2ML IJ SOLN
INTRAMUSCULAR | Status: AC
  Filled 2016-11-11: qty 2

## 2016-11-11 MED ORDER — LIDOCAINE 2% (20 MG/ML) 5 ML SYRINGE
INTRAMUSCULAR | Status: DC | PRN
  Administered 2016-11-11: 25 mg via INTRAVENOUS

## 2016-11-11 MED ORDER — PROPOFOL 500 MG/50ML IV EMUL
INTRAVENOUS | Status: AC
  Filled 2016-11-11: qty 50

## 2016-11-11 MED ORDER — SODIUM CHLORIDE 0.9 % IV SOLN
INTRAVENOUS | Status: DC
  Administered 2016-11-11: 08:00:00 via INTRAVENOUS

## 2016-11-11 MED ORDER — PROPOFOL 10 MG/ML IV BOLUS
INTRAVENOUS | Status: DC | PRN
  Administered 2016-11-11 (×2): 50 mg via INTRAVENOUS

## 2016-11-11 MED ORDER — PHENYLEPHRINE HCL 10 MG/ML IJ SOLN
INTRAMUSCULAR | Status: AC
  Filled 2016-11-11: qty 1

## 2016-11-11 MED ORDER — LIDOCAINE HCL (PF) 2 % IJ SOLN
INTRAMUSCULAR | Status: AC
  Filled 2016-11-11: qty 2

## 2016-11-11 NOTE — Anesthesia Post-op Follow-up Note (Cosign Needed)
Anesthesia QCDR form completed.        

## 2016-11-11 NOTE — Transfer of Care (Signed)
Immediate Anesthesia Transfer of Care Note  Patient: Timothy Hicks  Procedure(s) Performed: Procedure(s): COLONOSCOPY WITH PROPOFOL (N/A)  Patient Location: Endoscopy Unit  Anesthesia Type:General  Level of Consciousness: awake and alert   Airway & Oxygen Therapy: Patient Spontanous Breathing and Patient connected to nasal cannula oxygen  Post-op Assessment: Report given to RN and Post -op Vital signs reviewed and stable  Post vital signs: Reviewed  Last Vitals:  Vitals:   11/11/16 0659 11/11/16 0818  BP: (!) 142/87 137/82  Pulse: 98 90  Resp: 16 14  Temp: 36.6 C 36.2 C    Last Pain:  Vitals:   11/11/16 0659  TempSrc: Tympanic         Complications: No apparent anesthesia complications
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
## 2016-11-11 NOTE — Op Note (Addendum)
[facility] Gastroenterology Patient Name: Timothy Hicks Procedure Date: 11/11/2016 7:17 AM MRN: [id number] Account #: [id number] Date of Birth: Mar 25, 1961 Admit Type: Outpatient Age: 56 Room: [facility] ENDO ROOM 1 Gender: Male Note Status: Finalized Procedure:            Colonoscopy Indications:          High risk colon cancer surveillance: Ulcerative                        pancolitis, Personal history of ulcerative colitis Providers:            [name], MD Referring MD:         Youlanda Roys. Lovie Macadamia, MD (Referring MD) Medicines:            Monitored Anesthesia Care Complications:        No immediate complications. Procedure:            Pre-Anesthesia Assessment:                       - ASA Grade Assessment: III - A patient with severe                        systemic disease.                       After obtaining informed consent, the colonoscope was                        passed under direct vision. Throughout the procedure,                        the patient's blood pressure, pulse, and oxygen                        saturations were monitored continuously. The                        Colonoscope was introduced through the anus and                        advanced to the the terminal ileum. The colonoscopy was                        performed without difficulty. The patient tolerated the                        procedure well. The quality of the bowel preparation                        was fair. Findings:      Inflammation characterized by congestion (edema), erosions, erythema,       friability and pseudopolyps was found as patches surrounded by normal       mucosa in the descending colon. This was probable inflammatory       pseudopolyps. Biopsies for histology were taken with a cold forceps from       the cecum, ascending colon, transverse colon, left transverse colon,       descending colon, sigmoid colon and rectum for evaluation of microscopic        colitis. The colon was relatively spared from the mid rectum to  the       procimal sigmoid with more noted activity in the descending through the       proximal transverse.      The digital rectal exam was normal.      A few small-mouthed diverticula were found in the sigmoid colon and       descending colon.      The terminal ileum appeared normal. Impression:           - Preparation of the colon was fair.                       - Pancolitis ulcerative colitis. Inflammation was found                        in the descending colon. This was probable inflammatory                        pseudopolyps. Biopsied.                       - Diverticulosis in the sigmoid colon and in the                        descending colon. Recommendation:       - Discharge patient to home.                       - Low residue diet for 2 days, then advance as                        tolerated to advance diet as tolerated.                       - Continue present medications. Procedure Code(s):    --- Professional ---                       [phone], Colonoscopy, flexible; with biopsy, single or                        multiple Diagnosis Code(s):    --- Professional ---                       K51.00, Ulcerative (chronic) pancolitis without                        complications                       Z87.19, Personal history of other diseases of the                        digestive system                       K57.30, Diverticulosis of large intestine without                        perforation or abscess without bleeding CPT copyright 2016 American Medical Association. All rights reserved. The codes documented in this report are preliminary and upon coder review may  be revised to meet current compliance requirements. [name], MD 11/11/2016 8:22:21 AM This report has  been signed electronically. Number of Addenda: 0 Note Initiated On: 11/11/2016 7:17 AM Scope Withdrawal Time: 0 hours 15 minutes 35 seconds   Total Procedure Duration: 0 hours 25 minutes 19 seconds       [facility]

## 2016-11-11 NOTE — Anesthesia Postprocedure Evaluation (Signed)
Anesthesia Post Note  Patient: Timothy Hicks  Procedure(s) Performed: Procedure(s) (LRB): COLONOSCOPY WITH PROPOFOL (N/A)  Patient location during evaluation: Endoscopy Anesthesia Type: General Level of consciousness: awake and alert and oriented Pain management: pain level controlled Vital Signs Assessment: post-procedure vital signs reviewed and stable Respiratory status: spontaneous breathing, nonlabored ventilation and respiratory function stable Cardiovascular status: blood pressure returned to baseline and stable Postop Assessment: no signs of nausea or vomiting Anesthetic complications: no     Last Vitals:  Vitals:   11/11/16 0820 11/11/16 0830  BP: 137/82 (!) 156/86  Pulse: 95 91  Resp: 17 13  Temp:      Last Pain:  Vitals:   11/11/16 0659  TempSrc: Tympanic                 [name]
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
## 2016-11-14 ENCOUNTER — Encounter: Payer: Self-pay | Admitting: Gastroenterology

## 2016-11-16 LAB — SURGICAL PATHOLOGY

## 2017-05-25 ENCOUNTER — Encounter: Payer: Self-pay | Admitting: *Deleted

## 2017-05-26 ENCOUNTER — Encounter: Payer: Self-pay | Admitting: *Deleted

## 2017-05-26 ENCOUNTER — Ambulatory Visit: Payer: Managed Care, Other (non HMO) | Admitting: Anesthesiology

## 2017-05-26 ENCOUNTER — Ambulatory Visit
Admission: RE | Admit: 2017-05-26 | Discharge: 2017-05-26 | Disposition: A | Discharge status: Home / Self Care | Payer: Managed Care, Other (non HMO) | Source: Ambulatory Visit | Attending: Gastroenterology | Admitting: Gastroenterology

## 2017-05-26 ENCOUNTER — Encounter
Admission: RE | Disposition: A | Discharge status: Home / Self Care | Payer: Self-pay | Source: Ambulatory Visit | Attending: Gastroenterology

## 2017-05-26 DIAGNOSIS — K5100 Ulcerative (chronic) pancolitis without complications: Secondary | ICD-10-CM | POA: Insufficient documentation

## 2017-05-26 DIAGNOSIS — Z87891 Personal history of nicotine dependence: Secondary | ICD-10-CM | POA: Diagnosis not present

## 2017-05-26 DIAGNOSIS — K6389 Other specified diseases of intestine: Secondary | ICD-10-CM | POA: Diagnosis not present

## 2017-05-26 DIAGNOSIS — K279 Peptic ulcer, site unspecified, unspecified as acute or chronic, without hemorrhage or perforation: Secondary | ICD-10-CM | POA: Diagnosis not present

## 2017-05-26 DIAGNOSIS — H409 Unspecified glaucoma: Secondary | ICD-10-CM | POA: Insufficient documentation

## 2017-05-26 DIAGNOSIS — E119 Type 2 diabetes mellitus without complications: Secondary | ICD-10-CM | POA: Diagnosis not present

## 2017-05-26 DIAGNOSIS — Z794 Long term (current) use of insulin: Secondary | ICD-10-CM | POA: Diagnosis not present

## 2017-05-26 DIAGNOSIS — Z79899 Other long term (current) drug therapy: Secondary | ICD-10-CM | POA: Diagnosis not present

## 2017-05-26 DIAGNOSIS — K219 Gastro-esophageal reflux disease without esophagitis: Secondary | ICD-10-CM | POA: Diagnosis not present

## 2017-05-26 DIAGNOSIS — K5190 Ulcerative colitis, unspecified, without complications: Secondary | ICD-10-CM | POA: Diagnosis present

## 2017-05-26 HISTORY — PX: COLONOSCOPY WITH PROPOFOL: SHX5780

## 2017-05-26 HISTORY — DX: Unspecified hemorrhoids: K64.9

## 2017-05-26 HISTORY — DX: Cortical age-related cataract, unspecified eye: H25.019

## 2017-05-26 HISTORY — DX: Gastritis, unspecified, without bleeding: K29.70

## 2017-05-26 HISTORY — DX: Unspecified glaucoma: H40.9

## 2017-05-26 LAB — GLUCOSE, CAPILLARY: GLUCOSE-CAPILLARY: 109 mg/dL — AB (ref 65–99)

## 2017-05-26 SURGERY — COLONOSCOPY WITH PROPOFOL
Anesthesia: General

## 2017-05-26 MED ORDER — SODIUM CHLORIDE 0.9 % IV SOLN: INTRAVENOUS | Status: DC

## 2017-05-26 MED ORDER — PROPOFOL 500 MG/50ML IV EMUL
INTRAVENOUS | Status: AC
  Filled 2017-05-26: qty 50

## 2017-05-26 MED ORDER — PROPOFOL 500 MG/50ML IV EMUL
INTRAVENOUS | Status: DC | PRN
  Administered 2017-05-26: 125 ug/kg/min via INTRAVENOUS

## 2017-05-26 MED ORDER — SODIUM CHLORIDE 0.9 % IV SOLN
INTRAVENOUS | Status: DC
  Administered 2017-05-26: 08:00:00 via INTRAVENOUS
  Administered 2017-05-26: 1000 mL via INTRAVENOUS

## 2017-05-26 MED ORDER — PROPOFOL 10 MG/ML IV BOLUS
INTRAVENOUS | Status: AC
  Filled 2017-05-26: qty 20

## 2017-05-26 MED ORDER — PROPOFOL 10 MG/ML IV BOLUS
INTRAVENOUS | Status: DC | PRN
  Administered 2017-05-26: 20 mg via INTRAVENOUS
  Administered 2017-05-26 (×2): 50 mg via INTRAVENOUS
  Administered 2017-05-26: 20 mg via INTRAVENOUS

## 2017-05-26 NOTE — Op Note (Signed)
[facility] Gastroenterology Patient Name: Timothy Hicks Procedure Date: 05/26/2017 8:23 AM MRN: [id number] Account #: [id number] Date of Birth: [birth year] Admit Type: Outpatient Age: 57 Room: [facility] ENDO ROOM 3 Gender: Male Note Status: Finalized Procedure:            Colonoscopy Indications:          Personal history of ulcerative colitis Providers:            [name], MD Referring MD:         Youlanda Roys. Lovie Macadamia, MD (Referring MD) Medicines:            Monitored Anesthesia Care Complications:        No immediate complications. Procedure:            Pre-Anesthesia Assessment:                       - ASA Grade Assessment: III - A patient with severe                        systemic disease.                       After obtaining informed consent, the colonoscope was                        passed under direct vision. Throughout the procedure,                        the patient's blood pressure, pulse, and oxygen                        saturations were monitored continuously. The                        Colonoscope was introduced through the anus and                        advanced to the the terminal ileum. The colonoscopy was                        performed without difficulty. The patient tolerated the                        procedure well. The quality of the bowel preparation                        was fair. Findings:      A localized area of mildly erythematous, granular and pseudopolypoid       mucosa was found at 90 cm proximal to the anus. Biopsies were taken with       a cold forceps for histology.      Inflammation characterized by congestion (edema), granularity and       pseudopolyps was found in a continuous and circumferential pattern from       the rectum to the cecum, with variable severity. No sites were spared.       This was mild to moderate in severity, and when compared to previous       examinations, the findings are improved. Biopsies  for histology were       taken with a cold forceps from the cecum,  ascending colon, transverse       colon, descending colon, sigmoid colon and rectum for evaluation of       microscopic colitis.      A 3 mm polypoid lesion was found in the distal rectum. The lesion was       semi-pedunculated. No bleeding was present. Biopsies/removal were taken       with a cold forceps for histology.      The terminal ileum appeared normal. Biopsies were taken with a cold       forceps for histology. Impression:           - Preparation of the colon was fair.                       - Erythematous, granular and pseudopolypoid mucosa at                        90 cm proximal to the anus. Biopsied.                       - Pancolitis ulcerative colitis. Inflammation was found                        from the rectum to the cecum. This was moderate in                        severity. The findings are improved compared to                        previous examinations. Biopsied.                       - Polypoid lesion in the distal rectum. Biopsied. Recommendation:       - Discharge patient to home.                       - Await pathology results.                       - Continue present medications.                       - Return to GI clinic in 3 weeks. Procedure Code(s):    --- Professional ---                       [phone], Colonoscopy, flexible; with biopsy, single or                        multiple Diagnosis Code(s):    --- Professional ---                       K63.89, Other specified diseases of intestine                       K51.00, Ulcerative (chronic) pancolitis without                        complications                       D49.0, Neoplasm of unspecified behavior of digestive  system                       Z87.19, Personal history of other diseases of the                        digestive system CPT copyright 2016 American Medical Association. All rights reserved. The codes  documented in this report are preliminary and upon coder review may  be revised to meet current compliance requirements. [name], MD 05/26/2017 9:25:22 AM This report has been signed electronically. Number of Addenda: 0 Note Initiated On: 05/26/2017 8:23 AM Scope Withdrawal Time: 0 hours 32 minutes 29 seconds  Total Procedure Duration: 0 hours 47 minutes 58 seconds       [facility]
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
## 2017-05-26 NOTE — Anesthesia Postprocedure Evaluation (Signed)
Anesthesia Post Note  Patient: Timothy Hicks  Procedure(s) Performed: COLONOSCOPY WITH PROPOFOL (N/A )  Patient location during evaluation: Endoscopy Anesthesia Type: General Level of consciousness: awake and alert Pain management: pain level controlled Vital Signs Assessment: post-procedure vital signs reviewed and stable Respiratory status: spontaneous breathing and respiratory function stable Cardiovascular status: stable Anesthetic complications: no     Last Vitals:  Vitals:   05/26/17 0718 05/26/17 0921  BP: (!) 144/91 135/90  Pulse: (!) 102 96  Resp: 20 (!) 27  Temp: 37.1 C (!) 36.3 C  SpO2: 100% 100%    Last Pain:  Vitals:   05/26/17 0921  TempSrc: Tympanic                 Castor Gittleman K

## 2017-05-26 NOTE — Anesthesia Preprocedure Evaluation (Signed)
Anesthesia Evaluation  Patient identified by MRN, date of birth, ID band Patient awake    Reviewed: Allergy & Precautions, NPO status , Patient's Chart, lab work & pertinent test results  History of Anesthesia Complications Negative for: history of anesthetic complications  Airway Mallampati: II       Dental  (+) Chipped   Pulmonary neg COPD, Recent URI , Resolved, former smoker,           Cardiovascular (-) hypertension(-) angina(-) Past MI and (-) CHF (-) dysrhythmias (-) Valvular Problems/Murmurs     Neuro/Psych neg Seizures    GI/Hepatic Neg liver ROS, PUD, GERD  Medicated,  Endo/Other  diabetes, Type 2, Oral Hypoglycemic Agents, Insulin Dependent  Renal/GU negative Renal ROS     Musculoskeletal   Abdominal   Peds  Hematology   Anesthesia Other Findings   Reproductive/Obstetrics                             Anesthesia Physical Anesthesia Plan  ASA: III  Anesthesia Plan: General   Post-op Pain Management:    Induction: Intravenous  PONV Risk Score and Plan: 2 and TIVA and Propofol infusion  Airway Management Planned: Nasal Cannula  Additional Equipment:   Intra-op Plan:   Post-operative Plan:   Informed Consent: I have reviewed the patients History and Physical, chart, labs and discussed the procedure including the risks, benefits and alternatives for the proposed anesthesia with the patient or authorized representative who has indicated his/her understanding and acceptance.     Plan Discussed with:   Anesthesia Plan Comments:         Anesthesia Quick Evaluation

## 2017-05-26 NOTE — Anesthesia Post-op Follow-up Note (Signed)
Anesthesia QCDR form completed.        

## 2017-05-26 NOTE — Transfer of Care (Signed)
Immediate Anesthesia Transfer of Care Note  Patient: Timothy Hicks  Procedure(s) Performed: COLONOSCOPY WITH PROPOFOL (N/A )  Patient Location: PACU and Endoscopy Unit  Anesthesia Type:General  Level of Consciousness: awake, alert  and oriented  Airway & Oxygen Therapy: Patient Spontanous Breathing and Patient connected to nasal cannula oxygen  Post-op Assessment: Report given to RN and Post -op Vital signs reviewed and stable  Post vital signs: Reviewed  Last Vitals:  Vitals:   05/26/17 0718  BP: (!) 144/91  Pulse: (!) 102  Resp: 20  Temp: 37.1 C  SpO2: 100%    Last Pain:  Vitals:   05/26/17 0718  TempSrc: Tympanic         Complications: No apparent anesthesia complications

## 2017-05-29 ENCOUNTER — Encounter: Payer: Self-pay | Admitting: Gastroenterology

## 2017-05-29 LAB — SURGICAL PATHOLOGY

## 2020-05-06 ENCOUNTER — Other Ambulatory Visit: Payer: No Typology Code available for payment source | Attending: Gastroenterology

## 2020-05-08 ENCOUNTER — Ambulatory Visit: Admit: 2020-05-08 | Payer: Managed Care, Other (non HMO)

## 2020-05-08 SURGERY — COLONOSCOPY
Anesthesia: General

## 2021-01-30 ENCOUNTER — Other Ambulatory Visit
Admission: RE | Admit: 2021-01-30 | Discharge: 2021-01-30 | Disposition: A | Discharge status: Home / Self Care | Payer: No Typology Code available for payment source | Attending: Gastroenterology | Admitting: Gastroenterology

## 2021-01-30 ENCOUNTER — Inpatient Hospital Stay: Admit: 2021-01-30 | Payer: No Typology Code available for payment source

## 2021-01-30 DIAGNOSIS — K5100 Ulcerative (chronic) pancolitis without complications: Secondary | ICD-10-CM | POA: Insufficient documentation

## 2021-02-02 LAB — CALPROTECTIN, FECAL: Calprotectin, Fecal: 1247 ug/g — ABNORMAL HIGH (ref 0–120)

## 2021-04-12 ENCOUNTER — Other Ambulatory Visit: Payer: Self-pay

## 2021-04-12 ENCOUNTER — Emergency Department: Payer: No Typology Code available for payment source

## 2021-04-12 ENCOUNTER — Encounter: Payer: Self-pay | Admitting: Emergency Medicine

## 2021-04-12 ENCOUNTER — Emergency Department
Admission: EM | Admit: 2021-04-12 | Discharge: 2021-04-12 | Disposition: A | Discharge status: Home / Self Care | Payer: No Typology Code available for payment source | Attending: Emergency Medicine | Admitting: Emergency Medicine

## 2021-04-12 DIAGNOSIS — Z794 Long term (current) use of insulin: Secondary | ICD-10-CM | POA: Insufficient documentation

## 2021-04-12 DIAGNOSIS — M549 Dorsalgia, unspecified: Secondary | ICD-10-CM | POA: Insufficient documentation

## 2021-04-12 DIAGNOSIS — Z87891 Personal history of nicotine dependence: Secondary | ICD-10-CM | POA: Diagnosis not present

## 2021-04-12 DIAGNOSIS — E119 Type 2 diabetes mellitus without complications: Secondary | ICD-10-CM | POA: Diagnosis not present

## 2021-04-12 DIAGNOSIS — M25531 Pain in right wrist: Secondary | ICD-10-CM | POA: Diagnosis present

## 2021-04-12 DIAGNOSIS — G8929 Other chronic pain: Secondary | ICD-10-CM | POA: Diagnosis not present

## 2021-04-12 MED ORDER — NAPROXEN 500 MG PO TABS: 500.0000 mg | ORAL_TABLET | Freq: Two times a day (BID) | ORAL | 0 refills | Status: DC

## 2021-04-12 MED ORDER — LIDOCAINE 5 % EX PTCH: 1.0000 | MEDICATED_PATCH | CUTANEOUS | 0 refills | Status: DC

## 2021-04-12 MED ORDER — KETOROLAC TROMETHAMINE 10 MG PO TABS
10.0000 mg | ORAL_TABLET | Freq: Once | ORAL | Status: AC
  Administered 2021-04-12: 10 mg via ORAL
  Filled 2021-04-12: qty 1

## 2021-04-12 MED ORDER — LIDOCAINE 5 % EX PTCH
1.0000 | MEDICATED_PATCH | CUTANEOUS | Status: DC
  Administered 2021-04-12: 1 via TRANSDERMAL
  Filled 2021-04-12: qty 1
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
## 2021-04-12 NOTE — ED Provider Notes (Signed)
Emergency Medicine Provider Triage Evaluation Note  Timothy Hicks , a 60 y.o. male  was evaluated in triage.  Pt complains of right wrist pain, chronic back pain.  Review of Systems  Positive: Right wrist pain, chronic back pain Negative: New injury  Physical Exam  Ht 6\' 1"  (1.854 m)   Wt 70.3 kg   BMI 20.45 kg/m  Gen:   Awake, no distress   Resp:  Normal effort  MSK:   Moves extremities without difficulty  Other:    Medical Decision Making  Medically screening exam initiated at 1:28 PM.  Appropriate orders placed.  ALY HAUSER was informed that the remainder of the evaluation will be completed by another provider, this initial triage assessment does not replace that evaluation, and the importance of remaining in the ED until their evaluation is complete.     Versie Starks, PA-C 04/12/21 1329    Lavonia Drafts, MD 04/12/21 1346

## 2021-04-12 NOTE — ED Triage Notes (Signed)
Pt reports has chronic back pain and is having pain again probably from the weather. Pt also reports sever pain to his right wrist as well. Pt states has injured it in the past but never had pain from it.

## 2021-04-30 ENCOUNTER — Other Ambulatory Visit
Admission: RE | Admit: 2021-04-30 | Discharge: 2021-04-30 | Disposition: A | Discharge status: Home / Self Care | Payer: No Typology Code available for payment source | Source: Ambulatory Visit | Attending: Gastroenterology | Admitting: Gastroenterology

## 2021-04-30 DIAGNOSIS — R197 Diarrhea, unspecified: Secondary | ICD-10-CM | POA: Diagnosis present

## 2021-04-30 LAB — GASTROINTESTINAL PANEL BY PCR, STOOL (REPLACES STOOL CULTURE)

## 2021-04-30 LAB — C DIFFICILE QUICK SCREEN W PCR REFLEX
C Diff antigen: NEGATIVE
C Diff interpretation: NOT DETECTED
C Diff toxin: NEGATIVE

## 2021-05-05 LAB — CALPROTECTIN, FECAL: Calprotectin, Fecal: 1808 ug/g — ABNORMAL HIGH (ref 0–120)

## 2021-06-11 ENCOUNTER — Other Ambulatory Visit: Payer: Self-pay | Admitting: Gastroenterology

## 2021-06-11 DIAGNOSIS — K5100 Ulcerative (chronic) pancolitis without complications: Secondary | ICD-10-CM

## 2021-06-29 ENCOUNTER — Other Ambulatory Visit: Payer: No Typology Code available for payment source

## 2021-06-29 ENCOUNTER — Ambulatory Visit: Payer: No Typology Code available for payment source

## 2021-07-09 ENCOUNTER — Ambulatory Visit
Admission: RE | Admit: 2021-07-09 | Discharge: 2021-07-09 | Disposition: A | Discharge status: Home / Self Care | Payer: PRIVATE HEALTH INSURANCE | Source: Ambulatory Visit | Attending: Gastroenterology | Admitting: Gastroenterology

## 2021-07-09 ENCOUNTER — Other Ambulatory Visit: Payer: Self-pay

## 2021-07-09 DIAGNOSIS — K5100 Ulcerative (chronic) pancolitis without complications: Secondary | ICD-10-CM | POA: Insufficient documentation

## 2021-07-09 LAB — POCT I-STAT CREATININE: Creatinine, Ser: 1.1 mg/dL (ref 0.61–1.24)

## 2021-07-09 MED ORDER — IOHEXOL 300 MG/ML  SOLN
100.0000 mL | Freq: Once | INTRAMUSCULAR | Status: AC | PRN
  Administered 2021-07-09: 100 mL via INTRAVENOUS

## 2021-10-07 ENCOUNTER — Ambulatory Visit (INDEPENDENT_AMBULATORY_CARE_PROVIDER_SITE_OTHER): Payer: No Typology Code available for payment source | Admitting: Dermatology

## 2021-10-07 DIAGNOSIS — L820 Inflamed seborrheic keratosis: Secondary | ICD-10-CM

## 2021-10-07 DIAGNOSIS — L739 Follicular disorder, unspecified: Secondary | ICD-10-CM | POA: Diagnosis not present

## 2021-10-07 DIAGNOSIS — D1800 Hemangioma unspecified site: Secondary | ICD-10-CM

## 2021-10-07 DIAGNOSIS — L700 Acne vulgaris: Secondary | ICD-10-CM | POA: Diagnosis not present

## 2021-10-07 DIAGNOSIS — Z1283 Encounter for screening for malignant neoplasm of skin: Secondary | ICD-10-CM | POA: Diagnosis not present

## 2021-10-07 DIAGNOSIS — L821 Other seborrheic keratosis: Secondary | ICD-10-CM

## 2021-10-07 DIAGNOSIS — Z8719 Personal history of other diseases of the digestive system: Secondary | ICD-10-CM

## 2021-10-07 DIAGNOSIS — L719 Rosacea, unspecified: Secondary | ICD-10-CM

## 2021-10-07 DIAGNOSIS — D229 Melanocytic nevi, unspecified: Secondary | ICD-10-CM

## 2021-10-07 DIAGNOSIS — L814 Other melanin hyperpigmentation: Secondary | ICD-10-CM

## 2021-10-07 DIAGNOSIS — L578 Other skin changes due to chronic exposure to nonionizing radiation: Secondary | ICD-10-CM

## 2021-10-07 MED ORDER — DOXYCYCLINE MONOHYDRATE 100 MG PO CAPS: 100.0000 mg | ORAL_CAPSULE | Freq: Every day | ORAL | 4 refills | Status: DC

## 2021-10-07 NOTE — Progress Notes (Signed)
New Patient Visit  Subjective  Timothy Hicks is a 62 y.o. male who presents for the following: Total body skin exam (No hx, pt is on biologic and gastro dr recommend skin check) and bumps (Back, 1wk, no symptoms). The patient presents for Total-Body Skin Exam (TBSE) for skin cancer screening and mole check.  The patient has spots, moles and lesions to be evaluated, some may be new or changing and the patient has concerns that these could be cancer.   The following portions of the chart were reviewed this encounter and updated as appropriate:   Tobacco  Allergies  Meds  Problems  Med Hx  Surg Hx  Fam Hx     Review of Systems:  No other skin or systemic complaints except as noted in HPI or Assessment and Plan.  Objective  Well appearing patient in no apparent distress; mood and affect are within normal limits.  A full examination was performed including scalp, head, eyes, ears, nose, lips, neck, chest, axillae, abdomen, back, buttocks, bilateral upper extremities, bilateral lower extremities, hands, feet, fingers, toes, fingernails, and toenails. All findings within normal limits unless otherwise noted below.  back Follicular based paps back         face Erythema face     Chest - Medial (Center) Pink paps chest, scarring back     L mid back lat x 1, L ant shoulder x 1 (2) Stuck on waxy paps with erythema   Assessment & Plan   Lentigines - Scattered tan macules - Due to sun exposure - Benign-appearing, observe - Recommend daily broad spectrum sunscreen SPF 30+ to sun-exposed areas, reapply every 2 hours as needed. - Call for any changes  Seborrheic Keratoses - Stuck-on, waxy, tan-brown papules and/or plaques  - Benign-appearing - Discussed benign etiology and prognosis. - Observe - Call for any changes  Melanocytic Nevi - Tan-brown and/or pink-flesh-colored symmetric macules and papules - Benign appearing on exam today - Observation - Call  clinic for new or changing moles - Recommend daily use of broad spectrum spf 30+ sunscreen to sun-exposed areas.   Hemangiomas - Red papules - Discussed benign nature - Observe - Call for any changes  Actinic Damage - Chronic condition, secondary to cumulative UV/sun exposure - diffuse scaly erythematous macules with underlying dyspigmentation - Recommend daily broad spectrum sunscreen SPF 30+ to sun-exposed areas, reapply every 2 hours as needed.  - Staying in the shade or wearing long sleeves, sun glasses (UVA+UVB protection) and wide brim hats (4-inch brim around the entire circumference of the hat) are also recommended for sun protection.  - Call for new or changing lesions.  Skin cancer screening performed today.   Hx of Ulcerative Colitis - Pt on immunosuppressant previously on Humira recently changed to Inflectra, recommend yearly skin screenings.  Folliculitis back Pt recently changed from Humira to Inflectra infusions and thinks the break outs are related to new medication Rosacea and acne face Rosacea is a chronic progressive skin condition usually affecting the face of adults, causing redness and/or acne bumps. It is treatable but not curable. It sometimes affects the eyes (ocular rosacea) as well. It may respond to topical and/or systemic medication and can flare with stress, sun exposure, alcohol, exercise and some foods.  Daily application of broad spectrum spf 30+ sunscreen to face is recommended to reduce flares.  Start Doxycycline '100mg'$  1 po qd with evening meal  Doxycycline should be taken with food to prevent nausea. Do not lay down for  30 minutes after taking. Be cautious with sun exposure and use good sun protection while on this medication. Pregnant women should not take this medication.    doxycycline (MONODOX) 100 MG capsule - back Take 1 capsule (100 mg total) by mouth daily. 1 po qd in the evening with dinner  Inflamed seborrheic keratosis (2) L mid back  lat x 1, L ant shoulder x 1 Symptomatic, irritating, patient would like treated.  Destruction of lesion - L mid back lat x 1, L ant shoulder x 1 Complexity: simple   Destruction method: cryotherapy   Informed consent: discussed and consent obtained   Timeout:  patient name, date of birth, surgical site, and procedure verified Lesion destroyed using liquid nitrogen: Yes   Region frozen until ice ball extended beyond lesion: Yes   Outcome: patient tolerated procedure well with no complications   Post-procedure details: wound care instructions given    Return in about 8 weeks (around 12/02/2021) for Acne f/u.  I, Othelia Pulling, RMA, am acting as scribe for Sarina Ser, MD . Documentation: I have reviewed the above documentation for accuracy and completeness, and I agree with the above.  Sarina Ser, MD

## 2021-10-07 NOTE — Patient Instructions (Addendum)
Doxycycline should be taken with food to prevent nausea. Do not lay down for 30 minutes after taking. Be cautious with sun exposure and use good sun protection while on this medication. Pregnant women should not take this medication.    Cryotherapy Aftercare  Wash gently with soap and water everyday.   Apply Vaseline and Band-Aid daily until healed.   If You Need Anything After Your Visit  If you have any questions or concerns for your doctor, please call our main line at [phone] and press option 4 to reach your doctor's medical assistant. If no one answers, please leave a voicemail as directed and we will return your call as soon as possible. Messages left after 4 pm will be answered the following business day.   You may also send [place] a message via [place]. We typically respond to MyChart messages within 1-2 business days.  For prescription refills, please ask your pharmacy to contact our office. Our fax number is [phone].  If you have an urgent issue when the clinic is closed that cannot wait until the next business day, you can page your doctor at the number below.    Please note that while we do our best to be available for urgent issues outside of office hours, we are not available 24/7.   If you have an urgent issue and are unable to reach [place], you may choose to seek medical care at your doctor's office, retail clinic, urgent care center, or emergency room.  If you have a medical emergency, please immediately call 911 or go to the emergency department.  Pager Numbers  - Dr. Nehemiah Massed: [phone]  - Dr. Laurence Ferrari: [phone]  - Dr. Nicole Kindred: [phone]  In the event of inclement weather, please call our main line at [phone] for an update on the status of any delays or closures.  Dermatology Medication Tips: Please keep the boxes that topical medications come in in order to help keep track of the instructions about where and how to use these. Pharmacies typically  print the medication instructions only on the boxes and not directly on the medication tubes.   If your medication is too expensive, please contact our office at [phone] option 4 or send [place] a message through [employer].   We are unable to tell what your co-pay for medications will be in advance as this is different depending on your insurance coverage. However, we may be able to find a substitute medication at lower cost or fill out paperwork to get insurance to cover a needed medication.   If a prior authorization is required to get your medication covered by your insurance company, please allow [place] 1-2 business days to complete this process.  Drug prices often vary depending on where the prescription is filled and some pharmacies may offer cheaper prices.  The website www.goodrx.com contains coupons for medications through different pharmacies. The prices here do not account for what the cost may be with help from insurance (it may be cheaper with your insurance), but the website can give you the price if you did not use any insurance.  - You can print the associated coupon and take it with your prescription to the pharmacy.  - You may also stop by our office during regular business hours and pick up a GoodRx coupon card.  - If you need your prescription sent electronically to a different pharmacy, notify our office through [facility] or by phone at [phone] option 4.     Si  Usted Necesita Algo Despus de Su Visita  Tambin puede enviarnos un mensaje a travs de Pharmacist, community. Por lo general respondemos a los mensajes de MyChart en el transcurso de 1 a 2 das hbiles.  Para renovar recetas, por favor pida a su farmacia que se ponga en contacto con nuestra oficina. Harland Dingwall de fax es [place] [phone].  Si tiene un asunto urgente cuando la clnica est cerrada y que no puede esperar hasta el siguiente da hbil, puede llamar/localizar a su doctor(a) al nmero que aparece a  continuacin.   Por favor, tenga en cuenta que aunque hacemos todo lo posible para estar disponibles para asuntos urgentes fuera del horario de [place], no estamos disponibles las 24 horas del da, los 7 das de la [place].   Si tiene un problema urgente y no puede comunicarse con nosotros, puede optar por buscar atencin mdica  en el consultorio de su doctor(a), en una clnica privada, en un centro de atencin urgente o en una sala de emergencias.  Si tiene Engineering geologist, por favor llame inmediatamente al 911 o vaya a la sala de emergencias.  Nmeros de bper  - Dr. Nehemiah Massed: [phone]  - Dra. Moye: [phone]  - Dra. Nicole Kindred: [phone]  En caso de inclemencias del [place], por favor llame a Johnsie Kindred principal al [phone] para una actualizacin sobre el [place] de cualquier retraso o cierre.  Consejos para la medicacin en dermatologa: Por favor, guarde las cajas en las que vienen los medicamentos de uso tpico para ayudarle a seguir las instrucciones sobre dnde y cmo usarlos. Las farmacias generalmente imprimen las instrucciones del medicamento slo en las cajas y no directamente en los tubos del [place].   Si su medicamento es muy caro, por favor, pngase en contacto con Zigmund Daniel llamando al [phone] y presione la opcin 4 o envenos un mensaje a travs de Pharmacist, community.   No podemos decirle cul ser su copago por los medicamentos por adelantado ya que esto es diferente dependiendo de la cobertura de su seguro. Sin embargo, es posible que podamos encontrar un medicamento sustituto a Electrical engineer un formulario para que el seguro cubra el medicamento que se considera necesario.   Si se requiere una autorizacin previa para que su compaa de seguros [place] su medicamento, por favor permtanos de 1 a 2 das hbiles para completar este proceso.  Los precios de los medicamentos varan con frecuencia dependiendo del Environmental consultant de dnde se surte la receta  y alguna farmacias pueden ofrecer precios ms baratos.  El sitio web www.goodrx.com tiene cupones para medicamentos de Airline pilot. Los precios aqu no tienen en cuenta lo que podra costar con la ayuda del seguro (puede ser ms barato con su seguro), pero el sitio web puede darle el precio si no utiliz Research scientist (physical sciences).  - Puede imprimir el cupn correspondiente y llevarlo con su receta a la farmacia.  - Tambin puede pasar por nuestra oficina durante el horario de atencin regular y Charity fundraiser una tarjeta de cupones de GoodRx.  - Si necesita que su receta se enve electrnicamente a una farmacia diferente, informe a nuestra oficina a travs de MyChart de [place] o por telfono llamando al [phone] y presione la opcin 4.

## 2021-10-17 ENCOUNTER — Encounter: Payer: Self-pay | Admitting: Dermatology

## 2021-11-04 ENCOUNTER — Other Ambulatory Visit: Payer: Self-pay

## 2021-11-04 ENCOUNTER — Emergency Department
Admission: EM | Admit: 2021-11-04 | Discharge: 2021-11-04 | Disposition: A | Discharge status: Home / Self Care | Payer: No Typology Code available for payment source | Attending: Emergency Medicine | Admitting: Emergency Medicine

## 2021-11-04 DIAGNOSIS — I959 Hypotension, unspecified: Secondary | ICD-10-CM | POA: Diagnosis not present

## 2021-11-04 DIAGNOSIS — D72829 Elevated white blood cell count, unspecified: Secondary | ICD-10-CM | POA: Diagnosis not present

## 2021-11-04 DIAGNOSIS — E119 Type 2 diabetes mellitus without complications: Secondary | ICD-10-CM | POA: Insufficient documentation

## 2021-11-04 DIAGNOSIS — K5090 Crohn's disease, unspecified, without complications: Secondary | ICD-10-CM | POA: Diagnosis not present

## 2021-11-04 DIAGNOSIS — E860 Dehydration: Secondary | ICD-10-CM | POA: Diagnosis not present

## 2021-11-04 DIAGNOSIS — R42 Dizziness and giddiness: Secondary | ICD-10-CM | POA: Diagnosis present

## 2021-11-04 DIAGNOSIS — R739 Hyperglycemia, unspecified: Secondary | ICD-10-CM

## 2021-11-04 LAB — COMPREHENSIVE METABOLIC PANEL
ALT: 14 U/L (ref 0–44)
AST: 23 U/L (ref 15–41)
Albumin: 2.8 g/dL — ABNORMAL LOW (ref 3.5–5.0)
Alkaline Phosphatase: 67 U/L (ref 38–126)
Anion gap: 9 (ref 5–15)
BUN: 10 mg/dL (ref 8–23)
CO2: 21 mmol/L — ABNORMAL LOW (ref 22–32)
Calcium: 8.4 mg/dL — ABNORMAL LOW (ref 8.9–10.3)
Chloride: 100 mmol/L (ref 98–111)
Creatinine, Ser: 0.99 mg/dL (ref 0.61–1.24)
GFR, Estimated: >60 mL/min (ref >60)
Glucose, Bld: 268 mg/dL — ABNORMAL HIGH (ref 70–99)
Potassium: 4.2 mmol/L (ref 3.5–5.1)
Sodium: 130 mmol/L — ABNORMAL LOW (ref 135–145)
Total Bilirubin: 0.5 mg/dL (ref 0.3–1.2)
Total Protein: 7 g/dL (ref 6.5–8.1)

## 2021-11-04 LAB — CBC
HCT: 31.9 % — ABNORMAL LOW (ref 39.0–52.0)
Hemoglobin: 10 g/dL — ABNORMAL LOW (ref 13.0–17.0)
MCH: 29 pg (ref 26.0–34.0)
MCHC: 31.3 g/dL (ref 30.0–36.0)
MCV: 92.5 fL (ref 80.0–100.0)
Platelets: 285 10*3/uL (ref 150–400)
RBC: 3.45 MIL/uL — ABNORMAL LOW (ref 4.22–5.81)
RDW: 13.4 % (ref 11.5–15.5)
WBC: 13.4 10*3/uL — ABNORMAL HIGH (ref 4.0–10.5)
nRBC: 0 % (ref 0.0–0.2)

## 2021-11-04 LAB — URINALYSIS, ROUTINE W REFLEX MICROSCOPIC
Bilirubin Urine: NEGATIVE
Glucose, UA: NEGATIVE mg/dL
Hgb urine dipstick: NEGATIVE
Ketones, ur: NEGATIVE mg/dL
Leukocytes,Ua: NEGATIVE
Nitrite: NEGATIVE
Protein, ur: NEGATIVE mg/dL
Specific Gravity, Urine: 1.003 — ABNORMAL LOW (ref 1.005–1.030)
pH: 6 (ref 5.0–8.0)

## 2021-11-04 LAB — TROPONIN I (HIGH SENSITIVITY): Troponin I (High Sensitivity): 8 ng/L (ref <18)

## 2021-11-04 LAB — CBG MONITORING, ED
Glucose-Capillary: 206 mg/dL — ABNORMAL HIGH (ref 70–99)
Glucose-Capillary: 83 mg/dL (ref 70–99)

## 2021-11-04 MED ORDER — SODIUM CHLORIDE 0.9 % IV BOLUS
1000.0000 mL | Freq: Once | INTRAVENOUS | Status: AC
  Administered 2021-11-04: 1000 mL via INTRAVENOUS

## 2021-11-04 NOTE — ED Triage Notes (Signed)
Pt here from [facility] with dizziness for a few days. Pt is also having pain in his left shoulder due to a fall on Friday. Pt denies N/V/D.

## 2021-11-04 NOTE — ED Provider Notes (Signed)
[facility] Provider Note    Event Date/Time   First MD Initiated Contact with Patient 11/04/21 [phone]     (approximate)  History   Chief Complaint: Dizziness  HPI  Timothy Hicks is a 61 y.o. male with a past medical history of Crohn's, diabetes, presents to the emergency department for lightheadedness and low blood pressure.  According to the patient over the past 3 days or so he has been feeling very weak and lightheaded at times.  He states especially right when he stands up he gets very lightheaded.  Patient went to [facility] clinic this morning and was sent to the emergency department for low blood pressure 83/52 on arrival.  Patient does not take any blood pressure medications.  States he has had some slight discomfort in his chest intermittently over the past week or so.  No abdominal pain no vomiting diarrhea dysuria.  No recent illnesses fever cough or congestion.  Physical Exam   Triage Vital Signs: ED Triage Vitals  Enc Vitals Group     BP 11/04/21 0858 (!) 83/52     Pulse Rate 11/04/21 0857 89     Resp 11/04/21 0857 16     Temp 11/04/21 0900 98.1 F (36.7 C)     Temp Source 11/04/21 0900 Axillary     SpO2 11/04/21 0857 100 %     Weight 11/04/21 0857 154 lb 15.7 oz (70.3 kg)     Height 11/04/21 0857 '6\' 1"'$  (1.854 m)     Head Circumference --      Peak Flow --      Pain Score 11/04/21 0857 7     Pain Loc --      Pain Edu? --      Excl. in [place]? --     Most recent vital signs: Vitals:   11/04/21 0858 11/04/21 0900  BP: (!) 83/52   Pulse: 85   Resp: 16   Temp:  98.1 F (36.7 C)  SpO2: 100%     General: Awake, no distress.  CV:  Good peripheral perfusion.  Regular rate and rhythm  Resp:  Normal effort.  Equal breath sounds bilaterally.  Abd:  No distention.  Soft, nontender.  No rebound or guarding.   ED Results / Procedures / Treatments   EKG  EKG viewed and interpreted by myself shows normal sinus rhythm at 85 bpm with a  narrow QRS, normal axis, normal intervals, no concerning ST changes.  Reassuring EKG.   MEDICATIONS ORDERED IN ED: Medications  sodium chloride 0.9 % bolus 1,000 mL (has no administration in time range)     IMPRESSION / MDM / ASSESSMENT AND PLAN / ED COURSE  I reviewed the triage vital signs and the nursing notes.  Patient's presentation is most consistent with acute presentation with potential threat to life or bodily function.  Patient presents to the emergency department for dizziness/lightheadedness over the last 3 days, worse upon standing.  Patient overall appears well, does appear to have somewhat dry mucous membranes but states he has been eating and drinking like normal.  We will check labs including chemistry, we will IV hydrate.  Given his complaint of some slight intermittent chest discomfort we will obtain an EKG and troponin.  Differential would include metabolic or electrolyte abnormality such as renal insufficiency, dehydration, ACS, less likely infectious etiology.  Patient's work-up overall reassuring, slight leukocytosis at 13,000 otherwise reassuring CBC, anemia appears largely unchanged at least from January 2023 per care  everywhere labs.  CMP shows mild leukocytosis mild hyponatremia.  Patient states he is feeling much better after fluids.  Reassuringly negative troponin and a normal urinalysis.  Given the patient's reassuring work-up blood pressure is now 149/76.  Highly suspect dehydration given the blood pressure improvement with fluids.  Discussed with the patient increase fluids over next several days.  Patient agreeable to plan of care will follow-up with his doctor.  Although given the patient's initial complaint of hypotension I did consider admission to the hospital however given his reassuring work-up reassuring labs blood pressure improvement with fluids I believe the patient will be safe for discharge home.  FINAL CLINICAL IMPRESSION(S) / ED DIAGNOSES    Hypotension Dehydration   Note:  This document was prepared using Dragon voice recognition software and may include unintentional dictation errors.   Harvest Dark, MD 11/04/21 1326

## 2021-11-04 NOTE — ED Notes (Signed)
This tech attempted to call employer [employer] [phone], no answer Pt stated that his needs blood work according to his Freight forwarder for [facility].

## 2021-11-04 NOTE — ED Notes (Signed)
Pt works at [employer], no [employer] employer profile found in cone system. Pt will be inform to call supervision

## 2021-11-04 NOTE — ED Notes (Signed)
URINE AND BLOOD DRUG TEST WC COMPLETE.

## 2021-11-04 NOTE — ED Notes (Signed)
Pt is aware to call supervisor to get [facility] requirements.

## 2021-11-04 NOTE — ED Notes (Signed)
UA SPECIMEN SENT TO LAB

## 2021-11-26 ENCOUNTER — Encounter: Payer: Self-pay | Admitting: Dermatology

## 2021-11-26 DIAGNOSIS — L739 Follicular disorder, unspecified: Secondary | ICD-10-CM

## 2021-11-29 MED ORDER — DOXYCYCLINE MONOHYDRATE 100 MG PO CAPS: ORAL_CAPSULE | ORAL | 1 refills | Status: DC

## 2021-12-01 ENCOUNTER — Ambulatory Visit (INDEPENDENT_AMBULATORY_CARE_PROVIDER_SITE_OTHER): Payer: No Typology Code available for payment source | Admitting: Dermatology

## 2021-12-01 DIAGNOSIS — L739 Follicular disorder, unspecified: Secondary | ICD-10-CM | POA: Diagnosis not present

## 2021-12-01 MED ORDER — MUPIROCIN 2 % EX OINT: 1.0000 | TOPICAL_OINTMENT | Freq: Three times a day (TID) | CUTANEOUS | 2 refills | Status: AC

## 2021-12-01 NOTE — Patient Instructions (Signed)
Due to recent changes in healthcare laws, you may see results of your pathology and/or laboratory studies on MyChart before the doctors have had a chance to review them. We understand that in some cases there may be results that are confusing or concerning to you. Please understand that not all results are received at the same time and often the doctors may need to interpret multiple results in order to provide you with the best plan of care or course of treatment. Therefore, we ask that you please give us 2 business days to thoroughly review all your results before contacting the office for clarification. Should we see a critical lab result, you will be contacted sooner.   If You Need Anything After Your Visit  If you have any questions or concerns for your doctor, please call our main line at [phone] and press option 4 to reach your doctor's medical assistant. If no one answers, please leave a voicemail as directed and we will return your call as soon as possible. Messages left after 4 pm will be answered the following business day.   You may also send us a message via MyChart. We typically respond to MyChart messages within 1-2 business days.  For prescription refills, please ask your pharmacy to contact our office. Our fax number is [phone].  If you have an urgent issue when the clinic is closed that cannot wait until the next business day, you can page your doctor at the number below.    Please note that while we do our best to be available for urgent issues outside of office hours, we are not available 24/7.   If you have an urgent issue and are unable to reach us, you may choose to seek medical care at your doctor's office, retail clinic, urgent care center, or emergency room.  If you have a medical emergency, please immediately call 911 or go to the emergency department.  Pager Numbers  - Dr. Kowalski: [phone]  - Dr. Moye: [phone]  - Dr. Stewart:  [phone]  In the event of inclement weather, please call our main line at [phone] for an update on the status of any delays or closures.  Dermatology Medication Tips: Please keep the boxes that topical medications come in in order to help keep track of the instructions about where and how to use these. Pharmacies typically print the medication instructions only on the boxes and not directly on the medication tubes.   If your medication is too expensive, please contact our office at [phone] option 4 or send us a message through MyChart.   We are unable to tell what your co-pay for medications will be in advance as this is different depending on your insurance coverage. However, we may be able to find a substitute medication at lower cost or fill out paperwork to get insurance to cover a needed medication.   If a prior authorization is required to get your medication covered by your insurance company, please allow us 1-2 business days to complete this process.  Drug prices often vary depending on where the prescription is filled and some pharmacies may offer cheaper prices.  The website www.goodrx.com contains coupons for medications through different pharmacies. The prices here do not account for what the cost may be with help from insurance (it may be cheaper with your insurance), but the website can give you the price if you did not use any insurance.  - You can print the associated coupon and take it with   your prescription to the pharmacy.  - You may also stop by our office during regular business hours and pick up a GoodRx coupon card.  - If you need your prescription sent electronically to a different pharmacy, notify our office through [place] MyChart or by phone at [phone] option 4.     Si Usted Necesita Algo Despus de Su Visita  Tambin puede enviarnos un mensaje a travs de MyChart. Por lo general respondemos a los mensajes de MyChart en el transcurso de 1 a 2  das hbiles.  Para renovar recetas, por favor pida a su farmacia que se ponga en contacto con nuestra oficina. Nuestro nmero de fax es el [phone].  Si tiene un asunto urgente cuando la clnica est cerrada y que no puede esperar hasta el siguiente da hbil, puede llamar/localizar a su doctor(a) al nmero que aparece a continuacin.   Por favor, tenga en cuenta que aunque hacemos todo lo posible para estar disponibles para asuntos urgentes fuera del horario de oficina, no estamos disponibles las 24 horas del da, los 7 das de la semana.   Si tiene un problema urgente y no puede comunicarse con nosotros, puede optar por buscar atencin mdica  en el consultorio de su doctor(a), en una clnica privada, en un centro de atencin urgente o en una sala de emergencias.  Si tiene una emergencia mdica, por favor llame inmediatamente al 911 o vaya a la sala de emergencias.  Nmeros de bper  - Dr. Kowalski: [phone]  - Dra. Moye: [phone]  - Dra. Stewart: [phone]  En caso de inclemencias del tiempo, por favor llame a nuestra lnea principal al [phone] para una actualizacin sobre el estado de cualquier retraso o cierre.  Consejos para la medicacin en dermatologa: Por favor, guarde las cajas en las que vienen los medicamentos de uso tpico para ayudarle a seguir las instrucciones sobre dnde y cmo usarlos. Las farmacias generalmente imprimen las instrucciones del medicamento slo en las cajas y no directamente en los tubos del medicamento.   Si su medicamento es muy caro, por favor, pngase en contacto con nuestra oficina llamando al [phone] y presione la opcin 4 o envenos un mensaje a travs de MyChart.   No podemos decirle cul ser su copago por los medicamentos por adelantado ya que esto es diferente dependiendo de la cobertura de su seguro. Sin embargo, es posible que podamos encontrar un medicamento sustituto a menor costo o llenar un formulario para que el  seguro cubra el medicamento que se considera necesario.   Si se requiere una autorizacin previa para que su compaa de seguros cubra su medicamento, por favor permtanos de 1 a 2 das hbiles para completar este proceso.  Los precios de los medicamentos varan con frecuencia dependiendo del lugar de dnde se surte la receta y alguna farmacias pueden ofrecer precios ms baratos.  El sitio web www.goodrx.com tiene cupones para medicamentos de diferentes farmacias. Los precios aqu no tienen en cuenta lo que podra costar con la ayuda del seguro (puede ser ms barato con su seguro), pero el sitio web puede darle el precio si no utiliz ningn seguro.  - Puede imprimir el cupn correspondiente y llevarlo con su receta a la farmacia.  - Tambin puede pasar por nuestra oficina durante el horario de atencin regular y recoger una tarjeta de cupones de GoodRx.  - Si necesita que su receta se enve electrnicamente a una farmacia diferente, informe a nuestra oficina a travs de MyChart de [place]   o por telfono llamando al [phone] y presione la opcin 4.  

## 2021-12-01 NOTE — Progress Notes (Signed)
   Follow-Up Visit   Subjective  Timothy Hicks is a 61 y.o. male who presents for the following: Acne (2 months f/u acne folliculitis on the back, treating with Doxycycline 100 mg daily, pt report he has had diarrhea since starting Doxycycline ).  The following portions of the chart were reviewed this encounter and updated as appropriate:   Tobacco  Allergies  Meds  Problems  Med Hx  Surg Hx  Fam Hx     Review of Systems:  No other skin or systemic complaints except as noted in HPI or Assessment and Plan.  Objective  Well appearing patient in no apparent distress; mood and affect are within normal limits.  A focused examination was performed including back. Relevant physical exam findings are noted in the Assessment and Plan.  back Follicular based paps and pustules on the back            Assessment & Plan  Folliculitis Vs Medication side effect to Infliximab? Not responding to treatment back  Pt changed from Humira to Inflectra infusions and thinks the break outs are related to new medication.  **there are reports of Acneiform eruptions associated with Infliximab** (literature reviewed)  Pt takes Infliximab for Crohn's disease.  D/C doxycycline tablets due to diarrhea  Bacterial culture taken   Start Mupirocin ointment apply to affected skin tid   May consider biopsy at follow up visit   Discussed Isotretinoin therapy may be an option in the future.   Related Procedures Anaerobic and Aerobic Culture  Related Medications mupirocin ointment (BACTROBAN) 2 % Place 1 Application into the nose 3 (three) times daily.  Return if symptoms worsen or fail to improve.  IMarye Round, CMA, am acting as scribe for Sarina Ser, MD .  Documentation: I have reviewed the above documentation for accuracy and completeness, and I agree with the above.  Sarina Ser, MD

## 2021-12-02 ENCOUNTER — Other Ambulatory Visit: Payer: Self-pay | Admitting: Dermatology

## 2021-12-08 ENCOUNTER — Encounter: Payer: Self-pay | Admitting: Dermatology

## 2021-12-14 ENCOUNTER — Encounter: Payer: Self-pay | Admitting: Dermatology

## 2021-12-15 ENCOUNTER — Other Ambulatory Visit: Payer: Self-pay

## 2021-12-15 DIAGNOSIS — L739 Follicular disorder, unspecified: Secondary | ICD-10-CM

## 2021-12-15 LAB — SPECIMEN STATUS REPORT

## 2021-12-16 LAB — AEROBIC CULTURE

## 2021-12-16 LAB — SPECIMEN STATUS REPORT

## 2021-12-20 ENCOUNTER — Telehealth: Payer: Self-pay

## 2021-12-20 DIAGNOSIS — L089 Local infection of the skin and subcutaneous tissue, unspecified: Secondary | ICD-10-CM

## 2021-12-20 MED ORDER — CEPHALEXIN 500 MG PO CAPS: ORAL_CAPSULE | ORAL | 0 refills | Status: DC

## 2021-12-20 NOTE — Telephone Encounter (Signed)
-----   Message from Ralene Bathe, MD sent at 12/16/2021  6:52 PM EDT ----- Bacterial culture from pustule of skin on back from 12/02/2021 showed: Heavy growth Staph epidermidis.- this is a common bacteria on skin - can be seen in acne. Resistant to Tetracycline. Pt currently using topical mupirocin. CAN SEND IN CEPHALEXIN 500 MG 1PO BID #14 ORF  (MAKE SURE PT NOT ALLERGIC TO PENICILLIN) May consider other acne treatments. May be an acneiform side effect of his Infliximab treatment May want to schedule a follow up soon to discuss other treatment options (Isotretinoin?)

## 2021-12-20 NOTE — Telephone Encounter (Signed)
Patient informed of culture results and prescription sent to pharmacy. He states that he has a lot going on medically and will back to reschedule an appointment if needed.

## 2021-12-24 ENCOUNTER — Encounter: Payer: Self-pay | Admitting: Oncology

## 2021-12-24 ENCOUNTER — Inpatient Hospital Stay: Payer: No Typology Code available for payment source

## 2021-12-24 ENCOUNTER — Inpatient Hospital Stay: Payer: No Typology Code available for payment source | Attending: Oncology | Admitting: Oncology

## 2021-12-24 VITALS — BP 142/80 | HR 84 | Temp 97.6°F | Resp 16 | Wt 159.0 lb

## 2021-12-24 DIAGNOSIS — E119 Type 2 diabetes mellitus without complications: Secondary | ICD-10-CM | POA: Diagnosis not present

## 2021-12-24 DIAGNOSIS — Z7969 Long term (current) use of other immunomodulators and immunosuppressants: Secondary | ICD-10-CM | POA: Diagnosis not present

## 2021-12-24 DIAGNOSIS — Z789 Other specified health status: Secondary | ICD-10-CM | POA: Insufficient documentation

## 2021-12-24 DIAGNOSIS — Z794 Long term (current) use of insulin: Secondary | ICD-10-CM | POA: Insufficient documentation

## 2021-12-24 DIAGNOSIS — Z79899 Other long term (current) drug therapy: Secondary | ICD-10-CM | POA: Insufficient documentation

## 2021-12-24 DIAGNOSIS — F1010 Alcohol abuse, uncomplicated: Secondary | ICD-10-CM | POA: Diagnosis not present

## 2021-12-24 DIAGNOSIS — R5383 Other fatigue: Secondary | ICD-10-CM

## 2021-12-24 DIAGNOSIS — Z87891 Personal history of nicotine dependence: Secondary | ICD-10-CM | POA: Diagnosis not present

## 2021-12-24 DIAGNOSIS — Z7984 Long term (current) use of oral hypoglycemic drugs: Secondary | ICD-10-CM | POA: Diagnosis not present

## 2021-12-24 DIAGNOSIS — K5090 Crohn's disease, unspecified, without complications: Secondary | ICD-10-CM | POA: Insufficient documentation

## 2021-12-24 DIAGNOSIS — F1090 Alcohol use, unspecified, uncomplicated: Secondary | ICD-10-CM

## 2021-12-24 DIAGNOSIS — D649 Anemia, unspecified: Secondary | ICD-10-CM | POA: Insufficient documentation

## 2021-12-24 DIAGNOSIS — Z7982 Long term (current) use of aspirin: Secondary | ICD-10-CM | POA: Insufficient documentation

## 2021-12-24 LAB — IRON AND TIBC
Iron: 52 ug/dL (ref 45–182)
Saturation Ratios: 17 % — ABNORMAL LOW (ref 17.9–39.5)
TIBC: 308 ug/dL (ref 250–450)
UIBC: 256 ug/dL

## 2021-12-24 LAB — CBC WITH DIFFERENTIAL/PLATELET
Abs Immature Granulocytes: 0.17 10*3/uL — ABNORMAL HIGH (ref 0.00–0.07)
Basophils Absolute: 0.1 10*3/uL (ref 0.0–0.1)
Basophils Relative: 1 %
Eosinophils Absolute: 0.1 10*3/uL (ref 0.0–0.5)
Eosinophils Relative: 1 %
HCT: 32.6 % — ABNORMAL LOW (ref 39.0–52.0)
Hemoglobin: 10.1 g/dL — ABNORMAL LOW (ref 13.0–17.0)
Immature Granulocytes: 2 %
Lymphocytes Relative: 17 %
Lymphs Abs: 1.7 10*3/uL (ref 0.7–4.0)
MCH: 29.4 pg (ref 26.0–34.0)
MCHC: 31 g/dL (ref 30.0–36.0)
MCV: 95 fL (ref 80.0–100.0)
Monocytes Absolute: 0.4 10*3/uL (ref 0.1–1.0)
Monocytes Relative: 4 %
Neutro Abs: 7.6 10*3/uL (ref 1.7–7.7)
Neutrophils Relative %: 75 %
Platelets: 256 10*3/uL (ref 150–400)
RBC: 3.43 MIL/uL — ABNORMAL LOW (ref 4.22–5.81)
RDW: 15.7 % — ABNORMAL HIGH (ref 11.5–15.5)
WBC: 10 10*3/uL (ref 4.0–10.5)
nRBC: 0 % (ref 0.0–0.2)

## 2021-12-24 LAB — RETIC PANEL
Immature Retic Fract: 21.6 % — ABNORMAL HIGH (ref 2.3–15.9)
RBC.: 3.45 MIL/uL — ABNORMAL LOW (ref 4.22–5.81)
Retic Count, Absolute: 95.9 10*3/uL (ref 19.0–186.0)
Retic Ct Pct: 2.8 % (ref 0.4–3.1)
Reticulocyte Hemoglobin: 35.7 pg (ref >27.9)

## 2021-12-24 LAB — LACTATE DEHYDROGENASE: LDH: 94 U/L — ABNORMAL LOW (ref 98–192)

## 2021-12-24 LAB — FERRITIN: Ferritin: 153 ng/mL (ref 24–336)

## 2021-12-24 NOTE — Progress Notes (Signed)
Pt referred by Dr Jacqulyn Liner for iron deficiency anemia.

## 2021-12-24 NOTE — Assessment & Plan Note (Signed)
Likely anemia of chronic disease. Iron panel is reviewed.  Not typical for iron deficiency anemia however ferritin level may be falsely elevated due to chronic inflammation Check CBC, LDH, reticulocyte panel, multiple myeloma panel, light chain ratio, iron TIBC ferritin, soluble transferrin receptor level. He may continue oral iron supplementation for now.

## 2021-12-24 NOTE — Assessment & Plan Note (Signed)
Recommend patient to cut down alcohol use. Alcohol use may contribute to the chronic marrow suppression/anemia
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
## 2021-12-27 LAB — KAPPA/LAMBDA LIGHT CHAINS
Kappa free light chain: 42.4 mg/L — ABNORMAL HIGH (ref 3.3–19.4)
Kappa, lambda light chain ratio: 1.44 (ref 0.26–1.65)
Lambda free light chains: 29.5 mg/L — ABNORMAL HIGH (ref 5.7–26.3)

## 2021-12-29 LAB — MULTIPLE MYELOMA PANEL, SERUM
Albumin SerPl Elph-Mcnc: 3.3 g/dL (ref 2.9–4.4)
Albumin/Glob SerPl: 1 (ref 0.7–1.7)
Alpha 1: 0.2 g/dL (ref 0.0–0.4)
Alpha2 Glob SerPl Elph-Mcnc: 1 g/dL (ref 0.4–1.0)
B-Globulin SerPl Elph-Mcnc: 0.9 g/dL (ref 0.7–1.3)
Gamma Glob SerPl Elph-Mcnc: 1.4 g/dL (ref 0.4–1.8)
Globulin, Total: 3.5 g/dL (ref 2.2–3.9)
IgA: 265 mg/dL (ref 61–437)
IgG (Immunoglobin G), Serum: 1321 mg/dL (ref 603–1613)
IgM (Immunoglobulin M), Srm: 157 mg/dL (ref 20–172)
Total Protein ELP: 6.8 g/dL (ref 6.0–8.5)

## 2021-12-30 LAB — MISC LABCORP TEST (SEND OUT): Labcorp test code: 143305

## 2022-01-11 ENCOUNTER — Other Ambulatory Visit: Payer: Self-pay | Admitting: Physician Assistant

## 2022-01-11 DIAGNOSIS — R55 Syncope and collapse: Secondary | ICD-10-CM

## 2022-01-14 ENCOUNTER — Ambulatory Visit
Admission: RE | Admit: 2022-01-14 | Discharge: 2022-01-14 | Disposition: A | Discharge status: Home / Self Care | Payer: No Typology Code available for payment source | Source: Ambulatory Visit | Attending: Physician Assistant | Admitting: Physician Assistant

## 2022-01-14 DIAGNOSIS — R55 Syncope and collapse: Secondary | ICD-10-CM | POA: Diagnosis present

## 2022-01-18 ENCOUNTER — Telehealth: Payer: Self-pay

## 2022-01-18 DIAGNOSIS — D649 Anemia, unspecified: Secondary | ICD-10-CM

## 2022-01-18 NOTE — Telephone Encounter (Signed)
Spoke with patient regarding his culture results and possibly starting isotretinoin. He said everything is clearing up and he will call if he feels he needs to be seen again/hd

## 2022-01-18 NOTE — Telephone Encounter (Signed)
-----   Message from Earlie Server, MD sent at 01/18/2022  8:31 AM EDT ----- Please let patient know that her anemia is likely due to chronic inflammation due to autoimmune disease. Maybe there is a component of alcohol induced bone marrow suppression. Recommend observation.  Please schedule patient to come back in 6 months, labs please order CBC CMP reticulocyte panel, iron TIBC ferritin, folate and B12 prior to MD visit.  Thank you

## 2022-01-18 NOTE — Telephone Encounter (Signed)
Patient informed of MD recommendation and verbalized understanding.   Please schedule patient for labs in 6 months, then MD 1-2 days after labs. Please inform pt of appt.

## 2022-02-15 ENCOUNTER — Other Ambulatory Visit: Payer: Self-pay | Admitting: Dermatology

## 2022-02-15 DIAGNOSIS — L739 Follicular disorder, unspecified: Secondary | ICD-10-CM

## 2022-04-23 ENCOUNTER — Other Ambulatory Visit: Payer: Self-pay | Admitting: Dermatology

## 2022-04-23 DIAGNOSIS — L739 Follicular disorder, unspecified: Secondary | ICD-10-CM

## 2022-05-06 ENCOUNTER — Encounter: Payer: Self-pay | Admitting: *Deleted

## 2022-05-09 ENCOUNTER — Other Ambulatory Visit: Payer: Self-pay

## 2022-05-09 ENCOUNTER — Encounter: Payer: Self-pay | Admitting: *Deleted

## 2022-05-09 ENCOUNTER — Ambulatory Visit: Payer: No Typology Code available for payment source | Admitting: Certified Registered"

## 2022-05-09 ENCOUNTER — Ambulatory Visit
Admission: RE | Admit: 2022-05-09 | Discharge: 2022-05-09 | Disposition: A | Discharge status: Home / Self Care | Payer: No Typology Code available for payment source | Attending: Gastroenterology | Admitting: Gastroenterology

## 2022-05-09 ENCOUNTER — Encounter
Admission: RE | Disposition: A | Discharge status: Home / Self Care | Payer: Self-pay | Source: Home / Self Care | Attending: Gastroenterology

## 2022-05-09 DIAGNOSIS — K640 First degree hemorrhoids: Secondary | ICD-10-CM | POA: Insufficient documentation

## 2022-05-09 DIAGNOSIS — K51012 Ulcerative (chronic) pancolitis with intestinal obstruction: Secondary | ICD-10-CM | POA: Diagnosis not present

## 2022-05-09 DIAGNOSIS — Z794 Long term (current) use of insulin: Secondary | ICD-10-CM | POA: Insufficient documentation

## 2022-05-09 DIAGNOSIS — K2950 Unspecified chronic gastritis without bleeding: Secondary | ICD-10-CM | POA: Insufficient documentation

## 2022-05-09 DIAGNOSIS — M109 Gout, unspecified: Secondary | ICD-10-CM | POA: Insufficient documentation

## 2022-05-09 DIAGNOSIS — K219 Gastro-esophageal reflux disease without esophagitis: Secondary | ICD-10-CM | POA: Insufficient documentation

## 2022-05-09 DIAGNOSIS — D509 Iron deficiency anemia, unspecified: Secondary | ICD-10-CM | POA: Diagnosis present

## 2022-05-09 DIAGNOSIS — Z79899 Other long term (current) drug therapy: Secondary | ICD-10-CM | POA: Insufficient documentation

## 2022-05-09 DIAGNOSIS — Z7984 Long term (current) use of oral hypoglycemic drugs: Secondary | ICD-10-CM | POA: Insufficient documentation

## 2022-05-09 DIAGNOSIS — Z87891 Personal history of nicotine dependence: Secondary | ICD-10-CM | POA: Diagnosis not present

## 2022-05-09 DIAGNOSIS — E119 Type 2 diabetes mellitus without complications: Secondary | ICD-10-CM | POA: Insufficient documentation

## 2022-05-09 HISTORY — PX: COLONOSCOPY WITH PROPOFOL: SHX5780

## 2022-05-09 HISTORY — PX: ESOPHAGOGASTRODUODENOSCOPY (EGD) WITH PROPOFOL: SHX5813

## 2022-05-09 LAB — GLUCOSE, CAPILLARY: Glucose-Capillary: 90 mg/dL (ref 70–99)

## 2022-05-09 SURGERY — ESOPHAGOGASTRODUODENOSCOPY (EGD) WITH PROPOFOL
Anesthesia: General

## 2022-05-09 MED ORDER — PROPOFOL 10 MG/ML IV BOLUS
INTRAVENOUS | Status: AC
  Filled 2022-05-09: qty 20

## 2022-05-09 MED ORDER — LIDOCAINE HCL (PF) 2 % IJ SOLN
INTRAMUSCULAR | Status: AC
  Filled 2022-05-09: qty 5

## 2022-05-09 MED ORDER — SODIUM CHLORIDE 0.9 % IV SOLN: INTRAVENOUS | Status: DC

## 2022-05-09 MED ORDER — LIDOCAINE HCL (CARDIAC) PF 100 MG/5ML IV SOSY
PREFILLED_SYRINGE | INTRAVENOUS | Status: DC | PRN
  Administered 2022-05-09: 100 mg via INTRAVENOUS

## 2022-05-09 MED ORDER — PROPOFOL 10 MG/ML IV BOLUS
INTRAVENOUS | Status: DC | PRN
  Administered 2022-05-09: 140 ug/kg/min via INTRAVENOUS
  Administered 2022-05-09: 120 mg via INTRAVENOUS

## 2022-05-09 MED ORDER — PROPOFOL 1000 MG/100ML IV EMUL
INTRAVENOUS | Status: AC
  Filled 2022-05-09: qty 100

## 2022-05-09 NOTE — Anesthesia Postprocedure Evaluation (Signed)
Anesthesia Post Note  Patient: Timothy Hicks  Procedure(s) Performed: ESOPHAGOGASTRODUODENOSCOPY (EGD) WITH PROPOFOL COLONOSCOPY WITH PROPOFOL  Patient location during evaluation: Endoscopy Anesthesia Type: General Level of consciousness: awake and alert Pain management: pain level controlled Vital Signs Assessment: post-procedure vital signs reviewed and stable Respiratory status: spontaneous breathing, nonlabored ventilation, respiratory function stable and patient connected to nasal cannula oxygen Cardiovascular status: blood pressure returned to baseline and stable Postop Assessment: no apparent nausea or vomiting Anesthetic complications: no   No notable events documented.   Last Vitals:  Vitals:   05/09/22 0858 05/09/22 0904  BP:  138/88  Pulse:  96  Resp: (!) 21 19  Temp:    SpO2:      Last Pain:  Vitals:   05/09/22 0846  TempSrc: Temporal  PainSc:                  Martha Clan

## 2022-05-09 NOTE — Transfer of Care (Signed)
Immediate Anesthesia Transfer of Care Note  Patient: Timothy Hicks  Procedure(s) Performed: ESOPHAGOGASTRODUODENOSCOPY (EGD) WITH PROPOFOL COLONOSCOPY WITH PROPOFOL  Patient Location: Endoscopy Unit  Anesthesia Type:General  Level of Consciousness: drowsy  Airway & Oxygen Therapy: Patient Spontanous Breathing  Post-op Assessment: Report given to RN and Post -op Vital signs reviewed and stable  Post vital signs: Reviewed and stable  Last Vitals:  Vitals Value Taken Time  BP 122/85 05/09/22 0843  Temp 35.8 0843  Pulse 103 05/09/22 0843  Resp 17 05/09/22 0843  SpO2 100 % 05/09/22 0843  Vitals shown include unvalidated device data.  Last Pain:  Vitals:   05/09/22 0721  TempSrc: Temporal  PainSc: 0-No pain         Complications: No notable events documented.
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
## 2022-05-09 NOTE — Interval H&P Note (Signed)
History and Physical Interval Note:  05/09/2022 7:55 AM  Timothy Hicks  has presented today for surgery, with the diagnosis of Ulcerative pancolitis,ida,.  The various methods of treatment have been discussed with the patient and family. After consideration of risks, benefits and other options for treatment, the patient has consented to  Procedure(s): ESOPHAGOGASTRODUODENOSCOPY (EGD) WITH PROPOFOL (N/A) COLONOSCOPY WITH PROPOFOL (N/A) as a surgical intervention.  The patient's history has been reviewed, patient examined, no change in status, stable for surgery.  I have reviewed the patient's chart and labs.  Questions were answered to the patient's satisfaction.     Lesly Rubenstein  Ok to proceed with EGD/Colonoscopy
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
## 2022-05-09 NOTE — Op Note (Signed)
[facility] Gastroenterology Patient Name: Timothy Hicks Procedure Date: 05/09/2022 7:55 AM MRN: [id number] Account #: [id number] Date of Birth: 1961/02/04 Admit Type: Outpatient Age: 61 Room: [facility] ENDO ROOM 3 Gender: Male Note Status: Finalized Instrument Name: Park Meo [id number] Procedure:             Colonoscopy Indications:           Iron deficiency anemia, Disease activity assessment of                         chronic ulcerative pancolitis Providers:             Andrey Farmer MD, MD Referring MD:          Youlanda Roys. Lovie Macadamia, MD (Referring MD) Medicines:             Monitored Anesthesia Care Complications:         No immediate complications. Estimated blood loss:                         Minimal. Procedure:             Pre-Anesthesia Assessment:                        - Prior to the procedure, a History and Physical was                         performed, and patient medications and allergies were                         reviewed. The patient is competent. The risks and                         benefits of the procedure and the sedation options and                         risks were discussed with the patient. All questions                         were answered and informed consent was obtained.                         Patient identification and proposed procedure were                         verified by the physician, the nurse, the                         anesthesiologist, the anesthetist and the technician                         in the endoscopy suite. Mental Status Examination:                         alert and oriented. Airway Examination: normal                         oropharyngeal airway and neck mobility. Respiratory  Examination: clear to auscultation. CV Examination:                         normal. Prophylactic Antibiotics: The patient does not                         require prophylactic antibiotics. Prior                          Anticoagulants: The patient has taken no anticoagulant                         or antiplatelet agents. ASA Grade Assessment: III - A                         patient with severe systemic disease. After reviewing                         the risks and benefits, the patient was deemed in                         satisfactory condition to undergo the procedure. The                         anesthesia plan was to use monitored anesthesia care                         (MAC). Immediately prior to administration of                         medications, the patient was re-assessed for adequacy                         to receive sedatives. The heart rate, respiratory                         rate, oxygen saturations, blood pressure, adequacy of                         pulmonary ventilation, and response to care were                         monitored throughout the procedure. The physical                         status of the patient was re-assessed after the                         procedure.                        After obtaining informed consent, the colonoscope was                         passed under direct vision. Throughout the procedure,                         the patient's blood pressure, pulse, and oxygen  saturations were monitored continuously. The                         Colonoscope was introduced through the anus and                         advanced to the the ileocecal valve. The colonoscopy                         was performed without difficulty. The patient                         tolerated the procedure well. The quality of the bowel                         preparation was inadequate. The ileocecal valve and                         the rectum were photographed. Findings:      The perianal and digital rectal examinations were normal.      A localized area of moderately erythematous mucosa was found at the       hepatic flexure and in the distal ascending  colon. There appeared to       also be a linear ulceration. Biopsies were taken with a cold forceps for       histology. Estimated blood loss was minimal.      A benign-appearing, intrinsic mild stenosis was found at the hepatic       flexure and was traversed.      A diffuse area of granular mucosa was found in the entire colon. This       appeared to be from scarring from chronic inflammation that is now       healed. Biopsies were taken with a cold forceps for histology. Estimated       blood loss was minimal.      Internal hemorrhoids were found during retroflexion. The hemorrhoids       were Grade I (internal hemorrhoids that do not prolapse).      The exam was otherwise without abnormality on direct and retroflexion       views. Made several attempts to intubate TI but was unsuccesful due to       looping. Impression:            - Preparation of the colon was inadequate.                        - Erythematous mucosa at the hepatic flexure and in                         the distal ascending colon. Biopsied.                        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                        - Granularity in the entire examined colon. Biopsied.                        - Internal hemorrhoids.                        -  The examination was otherwise normal on direct and                         retroflexion views. Recommendation:        - Discharge patient to home.                        - Resume previous diet.                        - Continue present medications.                        - Await pathology results.                        - Repeat colonoscopy in 1 year for surveillance.                        - Return to referring physician as previously                         scheduled. Procedure Code(s):     --- Professional ---                        [phone], Colonoscopy, flexible; with biopsy, single or                         multiple Diagnosis Code(s):     --- Professional ---                         K64.0, First degree hemorrhoids                        K51.012, Ulcerative (chronic) pancolitis with                         intestinal obstruction                        K63.89, Other specified diseases of intestine                        D50.9, Iron deficiency anemia, unspecified CPT copyright 2022 American Medical Association. All rights reserved. The codes documented in this report are preliminary and upon coder review may  be revised to meet current compliance requirements. Andrey Farmer MD, MD 05/09/2022 8:52:17 AM Number of Addenda: 0 Note Initiated On: 05/09/2022 7:55 AM Scope Withdrawal Time: 0 hours 8 minutes 32 seconds  Total Procedure Duration: 0 hours 17 minutes 28 seconds  Estimated Blood Loss:  Estimated blood loss was minimal.      [facility]

## 2022-05-09 NOTE — Op Note (Signed)
[facility] Gastroenterology Patient Name: Timothy Hicks Procedure Date: 05/09/2022 7:56 AM MRN: [id number] Account #: [id number] Date of Birth: Sep 14, 1960 Admit Type: Outpatient Age: 62 Room: [facility] ENDO ROOM 3 Gender: Male Note Status: Finalized Instrument Name: Upper Endoscope [id number] Procedure:             Upper GI endoscopy Indications:           Iron deficiency anemia Providers:             Andrey Farmer MD, MD Referring MD:          Youlanda Roys. Lovie Macadamia, MD (Referring MD) Medicines:             Monitored Anesthesia Care Complications:         No immediate complications. Estimated blood loss:                         Minimal. Procedure:             Pre-Anesthesia Assessment:                        - Prior to the procedure, a History and Physical was                         performed, and patient medications and allergies were                         reviewed. The patient is competent. The risks and                         benefits of the procedure and the sedation options and                         risks were discussed with the patient. All questions                         were answered and informed consent was obtained.                         Patient identification and proposed procedure were                         verified by the physician, the nurse, the                         anesthesiologist, the anesthetist and the technician                         in the endoscopy suite. Mental Status Examination:                         alert and oriented. Airway Examination: normal                         oropharyngeal airway and neck mobility. Respiratory                         Examination: clear to auscultation. CV Examination:  normal. Prophylactic Antibiotics: The patient does not                         require prophylactic antibiotics. Prior                         Anticoagulants: The patient has taken no anticoagulant                          or antiplatelet agents. ASA Grade Assessment: III - A                         patient with severe systemic disease. After reviewing                         the risks and benefits, the patient was deemed in                         satisfactory condition to undergo the procedure. The                         anesthesia plan was to use monitored anesthesia care                         (MAC). Immediately prior to administration of                         medications, the patient was re-assessed for adequacy                         to receive sedatives. The heart rate, respiratory                         rate, oxygen saturations, blood pressure, adequacy of                         pulmonary ventilation, and response to care were                         monitored throughout the procedure. The physical                         status of the patient was re-assessed after the                         procedure.                        After obtaining informed consent, the endoscope was                         passed under direct vision. Throughout the procedure,                         the patient's blood pressure, pulse, and oxygen                         saturations were monitored continuously. The Endoscope  was introduced through the mouth, and advanced to the                         second part of duodenum. The upper GI endoscopy was                         accomplished without difficulty. The patient tolerated                         the procedure well. Findings:      The examined esophagus was normal.      Diffuse moderate inflammation characterized by erythema was found in the       gastric body and in the gastric antrum. Biopsies were taken with a cold       forceps for Helicobacter pylori testing. Estimated blood loss was       minimal.      The examined duodenum was normal. Impression:            - Normal esophagus.                        - Gastritis.  Biopsied.                        - Normal examined duodenum. Recommendation:        - Await pathology results.                        - Perform a colonoscopy today. Procedure Code(s):     --- Professional ---                        [phone], Esophagogastroduodenoscopy, flexible,                         transoral; with biopsy, single or multiple Diagnosis Code(s):     --- Professional ---                        K29.70, Gastritis, unspecified, without bleeding                        D50.9, Iron deficiency anemia, unspecified CPT copyright 2022 American Medical Association. All rights reserved. The codes documented in this report are preliminary and upon coder review may  be revised to meet current compliance requirements. Andrey Farmer MD, MD 05/09/2022 8:44:08 AM Number of Addenda: 0 Note Initiated On: 05/09/2022 7:56 AM Estimated Blood Loss:  Estimated blood loss was minimal.      [facility]

## 2022-05-10 ENCOUNTER — Encounter: Payer: Self-pay | Admitting: Gastroenterology

## 2022-05-11 LAB — SURGICAL PATHOLOGY

## 2022-05-22 ENCOUNTER — Other Ambulatory Visit: Payer: Self-pay | Admitting: Dermatology

## 2022-05-22 DIAGNOSIS — L739 Follicular disorder, unspecified: Secondary | ICD-10-CM

## 2022-07-06 ENCOUNTER — Other Ambulatory Visit: Payer: Self-pay | Admitting: Dermatology

## 2022-07-21 ENCOUNTER — Inpatient Hospital Stay: Payer: No Typology Code available for payment source | Attending: Oncology

## 2022-07-21 ENCOUNTER — Other Ambulatory Visit: Payer: No Typology Code available for payment source

## 2022-07-21 DIAGNOSIS — F1090 Alcohol use, unspecified, uncomplicated: Secondary | ICD-10-CM | POA: Insufficient documentation

## 2022-07-21 DIAGNOSIS — D649 Anemia, unspecified: Secondary | ICD-10-CM | POA: Diagnosis present

## 2022-07-21 DIAGNOSIS — Z79899 Other long term (current) drug therapy: Secondary | ICD-10-CM | POA: Insufficient documentation

## 2022-07-21 LAB — COMPREHENSIVE METABOLIC PANEL
ALT: 17 U/L (ref 0–44)
AST: 18 U/L (ref 15–41)
Albumin: 4 g/dL (ref 3.5–5.0)
Alkaline Phosphatase: 68 U/L (ref 38–126)
Anion gap: 10 (ref 5–15)
BUN: 19 mg/dL (ref 8–23)
CO2: 24 mmol/L (ref 22–32)
Calcium: 10.2 mg/dL (ref 8.9–10.3)
Chloride: 102 mmol/L (ref 98–111)
Creatinine, Ser: 1.25 mg/dL — ABNORMAL HIGH (ref 0.61–1.24)
GFR, Estimated: >60 mL/min (ref >60)
Glucose, Bld: 208 mg/dL — ABNORMAL HIGH (ref 70–99)
Potassium: 4.6 mmol/L (ref 3.5–5.1)
Sodium: 136 mmol/L (ref 135–145)
Total Bilirubin: 0.6 mg/dL (ref 0.3–1.2)
Total Protein: 7.6 g/dL (ref 6.5–8.1)

## 2022-07-21 LAB — IRON AND TIBC
Iron: 84 ug/dL (ref 45–182)
Saturation Ratios: 22 % (ref 17.9–39.5)
TIBC: 388 ug/dL (ref 250–450)
UIBC: 304 ug/dL

## 2022-07-21 LAB — CBC WITH DIFFERENTIAL/PLATELET
Abs Immature Granulocytes: 0.07 10*3/uL (ref 0.00–0.07)
Basophils Absolute: 0 10*3/uL (ref 0.0–0.1)
Basophils Relative: 1 %
Eosinophils Absolute: 0.1 10*3/uL (ref 0.0–0.5)
Eosinophils Relative: 2 %
HCT: 42.4 % (ref 39.0–52.0)
Hemoglobin: 14 g/dL (ref 13.0–17.0)
Immature Granulocytes: 1 %
Lymphocytes Relative: 42 %
Lymphs Abs: 2.8 10*3/uL (ref 0.7–4.0)
MCH: 31.5 pg (ref 26.0–34.0)
MCHC: 33 g/dL (ref 30.0–36.0)
MCV: 95.3 fL (ref 80.0–100.0)
Monocytes Absolute: 0.6 10*3/uL (ref 0.1–1.0)
Monocytes Relative: 9 %
Neutro Abs: 3 10*3/uL (ref 1.7–7.7)
Neutrophils Relative %: 45 %
Platelets: 197 10*3/uL (ref 150–400)
RBC: 4.45 MIL/uL (ref 4.22–5.81)
RDW: 12.8 % (ref 11.5–15.5)
WBC: 6.5 10*3/uL (ref 4.0–10.5)
nRBC: 0 % (ref 0.0–0.2)

## 2022-07-21 LAB — RETIC PANEL
Immature Retic Fract: 8.5 % (ref 2.3–15.9)
RBC.: 4.41 MIL/uL (ref 4.22–5.81)
Retic Count, Absolute: 62.6 10*3/uL (ref 19.0–186.0)
Retic Ct Pct: 1.4 % (ref 0.4–3.1)
Reticulocyte Hemoglobin: 34.2 pg (ref >27.9)

## 2022-07-21 LAB — FOLATE: Folate: 34 ng/mL (ref >5.9)

## 2022-07-21 LAB — FERRITIN: Ferritin: 94 ng/mL (ref 24–336)

## 2022-07-21 LAB — VITAMIN B12: Vitamin B-12: 1163 pg/mL — ABNORMAL HIGH (ref 180–914)

## 2022-07-25 ENCOUNTER — Inpatient Hospital Stay: Payer: No Typology Code available for payment source | Admitting: Oncology

## 2022-07-27 ENCOUNTER — Inpatient Hospital Stay: Payer: No Typology Code available for payment source | Attending: Oncology | Admitting: Oncology

## 2022-07-27 ENCOUNTER — Encounter: Payer: Self-pay | Admitting: Oncology

## 2022-07-27 VITALS — BP 135/78 | HR 113 | Temp 97.1°F | Resp 18 | Wt 171.7 lb

## 2022-07-27 DIAGNOSIS — Z862 Personal history of diseases of the blood and blood-forming organs and certain disorders involving the immune mechanism: Secondary | ICD-10-CM | POA: Insufficient documentation

## 2022-07-27 DIAGNOSIS — Z789 Other specified health status: Secondary | ICD-10-CM

## 2022-07-27 DIAGNOSIS — F1090 Alcohol use, unspecified, uncomplicated: Secondary | ICD-10-CM | POA: Insufficient documentation

## 2022-07-27 DIAGNOSIS — D649 Anemia, unspecified: Secondary | ICD-10-CM

## 2022-07-27 DIAGNOSIS — Z79899 Other long term (current) drug therapy: Secondary | ICD-10-CM | POA: Insufficient documentation

## 2022-07-27 NOTE — Assessment & Plan Note (Signed)
Labs reviewed and discussed with patient.  Hemoglobin has improved.  No anemia.  This is likely due to alcohol cessation.
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
## 2022-07-27 NOTE — Assessment & Plan Note (Signed)
Encourage patient's alcohol cessation effort

## 2022-10-04 ENCOUNTER — Other Ambulatory Visit: Payer: Self-pay | Admitting: Dermatology

## 2023-04-14 ENCOUNTER — Encounter: Payer: Self-pay | Admitting: *Deleted

## 2023-04-17 ENCOUNTER — Encounter: Payer: Self-pay | Admitting: *Deleted

## 2023-05-05 ENCOUNTER — Encounter
Admission: RE | Disposition: A | Discharge status: Home / Self Care | Payer: Self-pay | Source: Ambulatory Visit | Attending: Gastroenterology

## 2023-05-05 ENCOUNTER — Ambulatory Visit: Payer: 59 | Admitting: Anesthesiology

## 2023-05-05 ENCOUNTER — Encounter: Payer: Self-pay | Admitting: *Deleted

## 2023-05-05 ENCOUNTER — Ambulatory Visit
Admission: RE | Admit: 2023-05-05 | Discharge: 2023-05-05 | Disposition: A | Discharge status: Home / Self Care | Payer: 59 | Source: Ambulatory Visit | Attending: Gastroenterology | Admitting: Gastroenterology

## 2023-05-05 DIAGNOSIS — K640 First degree hemorrhoids: Secondary | ICD-10-CM | POA: Diagnosis not present

## 2023-05-05 DIAGNOSIS — E119 Type 2 diabetes mellitus without complications: Secondary | ICD-10-CM | POA: Diagnosis not present

## 2023-05-05 DIAGNOSIS — Z7984 Long term (current) use of oral hypoglycemic drugs: Secondary | ICD-10-CM | POA: Diagnosis not present

## 2023-05-05 DIAGNOSIS — Z794 Long term (current) use of insulin: Secondary | ICD-10-CM | POA: Insufficient documentation

## 2023-05-05 DIAGNOSIS — Z87891 Personal history of nicotine dependence: Secondary | ICD-10-CM | POA: Diagnosis not present

## 2023-05-05 DIAGNOSIS — K51012 Ulcerative (chronic) pancolitis with intestinal obstruction: Secondary | ICD-10-CM | POA: Diagnosis present

## 2023-05-05 HISTORY — PX: BIOPSY: SHX5522

## 2023-05-05 HISTORY — PX: COLONOSCOPY WITH PROPOFOL: SHX5780

## 2023-05-05 HISTORY — DX: Hypo-osmolality and hyponatremia: E87.1

## 2023-05-05 HISTORY — DX: Anemia, unspecified: D64.9

## 2023-05-05 LAB — GLUCOSE, CAPILLARY: Glucose-Capillary: 129 mg/dL — ABNORMAL HIGH (ref 70–99)

## 2023-05-05 SURGERY — COLONOSCOPY WITH PROPOFOL
Anesthesia: General

## 2023-05-05 MED ORDER — LIDOCAINE HCL (PF) 2 % IJ SOLN
INTRAMUSCULAR | Status: AC
  Filled 2023-05-05: qty 5

## 2023-05-05 MED ORDER — PROPOFOL 500 MG/50ML IV EMUL
INTRAVENOUS | Status: DC | PRN
  Administered 2023-05-05: 120 ug/kg/min via INTRAVENOUS

## 2023-05-05 MED ORDER — PROPOFOL 10 MG/ML IV BOLUS
INTRAVENOUS | Status: AC
  Filled 2023-05-05: qty 40

## 2023-05-05 MED ORDER — SODIUM CHLORIDE 0.9 % IV SOLN: INTRAVENOUS | Status: DC

## 2023-05-05 MED ORDER — LIDOCAINE HCL (CARDIAC) PF 100 MG/5ML IV SOSY
PREFILLED_SYRINGE | INTRAVENOUS | Status: DC | PRN
  Administered 2023-05-05: 50 mg via INTRAVENOUS

## 2023-05-05 NOTE — Interval H&P Note (Signed)
History and Physical Interval Note:  05/05/2023 8:13 AM  Timothy Hicks  has presented today for surgery, with the diagnosis of ULCERATIVE PANCOLITIS.  The various methods of treatment have been discussed with the patient and family. After consideration of risks, benefits and other options for treatment, the patient has consented to  Procedure(s): COLONOSCOPY WITH PROPOFOL (N/A) as a surgical intervention.  The patient's history has been reviewed, patient examined, no change in status, stable for surgery.  I have reviewed the patient's chart and labs.  Questions were answered to the patient's satisfaction.     Regis Bill  Ok to proceed with colonoscopy
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
## 2023-05-05 NOTE — Op Note (Signed)
[facility] Gastroenterology Patient Name: Timothy Hicks Procedure Date: 05/05/2023 7:10 AM MRN: [id number] Account #: [id number] Date of Birth: [birth year] Admit Type: Outpatient Age: 62 Room: [facility] ENDO ROOM 3 Gender: Male Note Status: Finalized Instrument Name: Colonoscope [id number] Procedure:             Colonoscopy Indications:           Chronic ulcerative pancolitis Providers:             Eather Colas MD, MD Referring MD:          Teena Irani. Terance Hart, MD (Referring MD) Medicines:             Monitored Anesthesia Care Complications:         No immediate complications. Estimated blood loss:                         Minimal. Procedure:             Pre-Anesthesia Assessment:                        - Prior to the procedure, a History and Physical was                         performed, and patient medications and allergies were                         reviewed. The patient is competent. The risks and                         benefits of the procedure and the sedation options and                         risks were discussed with the patient. All questions                         were answered and informed consent was obtained.                         Patient identification and proposed procedure were                         verified by the physician, the nurse, the                         anesthesiologist, the anesthetist and the technician                         in the endoscopy suite. Mental Status Examination:                         alert and oriented. Airway Examination: normal                         oropharyngeal airway and neck mobility. Respiratory                         Examination: clear to auscultation. CV Examination:  normal. Prophylactic Antibiotics: The patient does not                         require prophylactic antibiotics. Prior                         Anticoagulants: The patient has taken no anticoagulant                          or antiplatelet agents. ASA Grade Assessment: III - A                         patient with severe systemic disease. After reviewing                         the risks and benefits, the patient was deemed in                         satisfactory condition to undergo the procedure. The                         anesthesia plan was to use monitored anesthesia care                         (MAC). Immediately prior to administration of                         medications, the patient was re-assessed for adequacy                         to receive sedatives. The heart rate, respiratory                         rate, oxygen saturations, blood pressure, adequacy of                         pulmonary ventilation, and response to care were                         monitored throughout the procedure. The physical                         status of the patient was re-assessed after the                         procedure.                        After obtaining informed consent, the colonoscope was                         passed under direct vision. Throughout the procedure,                         the patient's blood pressure, pulse, and oxygen                         saturations were monitored continuously. The  Colonoscope was introduced through the anus and                         advanced to the the terminal ileum. The colonoscopy                         was somewhat difficult due to significant looping.                         Successful completion of the procedure was aided by                         applying abdominal pressure. The patient tolerated the                         procedure well. The quality of the bowel preparation                         was adequate to identify polyps. The terminal ileum,                         ileocecal valve, appendiceal orifice, and rectum were                         photographed. Findings:      The perianal and digital rectal  examinations were normal.      The terminal ileum appeared normal.      Normal mucosa was found in the sigmoid colon, in the descending colon,       at the splenic flexure, in the ascending colon and in the cecum.       Biopsies were taken with a cold forceps for histology. Estimated blood       loss was minimal.      A benign-appearing, intrinsic mild stenosis was found in the proximal       transverse colon, in the mid transverse colon and at the hepatic flexure       and was traversed. Biopsies were taken with a cold forceps for       histology. Estimated blood loss was minimal. Some pseudopolyps noted in       this area as well      Internal hemorrhoids were found during retroflexion. The hemorrhoids       were Grade I (internal hemorrhoids that do not prolapse).      The exam was otherwise without abnormality on direct and retroflexion       views. Impression:            - The examined portion of the ileum was normal.                        - Normal mucosa in the sigmoid colon, in the                         descending colon, at the splenic flexure, in the                         ascending colon and in the cecum. Biopsied.                        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                         Biopsied.                        - Internal hemorrhoids.                        - The examination was otherwise normal on direct and                         retroflexion views. Recommendation:        - Discharge patient to home.                        - Resume previous diet.                        - Continue present medications.                        - Await pathology results.                        - Repeat colonoscopy for surveillance based on                         pathology results.                        - Return to referring physician as previously                          scheduled. Procedure Code(s):     --- Professional ---                        [phone], Colonoscopy, flexible; with biopsy, single or                         multiple Diagnosis Code(s):     --- Professional ---                        K64.0, First degree hemorrhoids                        K51.012, Ulcerative (chronic) pancolitis with                         intestinal obstruction CPT copyright 2022 American [employer]. All rights reserved. The codes documented in this report are preliminary and upon coder review may  be revised to meet current compliance requirements. Eather Colas MD, MD 05/05/2023 8:46:12 AM Number of Addenda: 0 Note Initiated On: 05/05/2023 7:10 AM Scope Withdrawal Time: 0 hours 11 minutes 8 seconds  Total Procedure Duration: 0 hours 19 minutes 0 seconds  Estimated Blood Loss:  Estimated blood loss was minimal.      [facility]

## 2023-05-05 NOTE — Transfer of Care (Signed)
Immediate Anesthesia Transfer of Care Note  Patient: Timothy Hicks  Procedure(s) Performed: COLONOSCOPY WITH PROPOFOL BIOPSY  Patient Location: PACU  Anesthesia Type:MAC  Level of Consciousness: sedated  Airway & Oxygen Therapy: Patient Spontanous Breathing  Post-op Assessment: Report given to RN and Post -op Vital signs reviewed and stable  Post vital signs: stable  Last Vitals:  Vitals Value Taken Time  BP 120/79 05/05/23 0845  Temp 97.7   Pulse 94 05/05/23 0845  Resp 20 05/05/23 0845  SpO2 99 % 05/05/23 0845    Last Pain:  Vitals:   05/05/23 0845  TempSrc:   PainSc: Asleep         Complications: No notable events documented.
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
## 2023-05-05 NOTE — Anesthesia Postprocedure Evaluation (Signed)
Anesthesia Post Note  Patient: Timothy Hicks  Procedure(s) Performed: COLONOSCOPY WITH PROPOFOL BIOPSY  Patient location during evaluation: Endoscopy Anesthesia Type: General Level of consciousness: awake and alert Pain management: pain level controlled Vital Signs Assessment: post-procedure vital signs reviewed and stable Respiratory status: spontaneous breathing, nonlabored ventilation, respiratory function stable and patient connected to nasal cannula oxygen Cardiovascular status: blood pressure returned to baseline and stable Postop Assessment: no apparent nausea or vomiting Anesthetic complications: no   No notable events documented.   Last Vitals:  Vitals:   05/05/23 0853 05/05/23 0903  BP: 134/85 139/87  Pulse: 97 93  Resp: (!) 21 (!) 21  Temp:    SpO2: 99% 100%    Last Pain:  Vitals:   05/05/23 0903  TempSrc:   PainSc: 0-No pain                 Louie Boston

## 2023-05-08 ENCOUNTER — Encounter: Payer: Self-pay | Admitting: Gastroenterology

## 2023-05-08 LAB — SURGICAL PATHOLOGY

## 2023-11-18 ENCOUNTER — Emergency Department

## 2023-11-18 ENCOUNTER — Encounter
Admission: EM | Disposition: A | Discharge status: Home / Self Care | Payer: Self-pay | Source: Home / Self Care | Attending: Emergency Medicine

## 2023-11-18 ENCOUNTER — Other Ambulatory Visit: Payer: Self-pay

## 2023-11-18 ENCOUNTER — Ambulatory Visit: Admitting: Anesthesiology

## 2023-11-18 ENCOUNTER — Ambulatory Visit
Admission: EM | Admit: 2023-11-18 | Discharge: 2023-11-18 | Disposition: A | Discharge status: Home / Self Care | Attending: Emergency Medicine | Admitting: Emergency Medicine

## 2023-11-18 ENCOUNTER — Encounter: Payer: Self-pay | Admitting: Gastroenterology

## 2023-11-18 DIAGNOSIS — Z7984 Long term (current) use of oral hypoglycemic drugs: Secondary | ICD-10-CM | POA: Diagnosis not present

## 2023-11-18 DIAGNOSIS — K2100 Gastro-esophageal reflux disease with esophagitis, without bleeding: Secondary | ICD-10-CM | POA: Diagnosis not present

## 2023-11-18 DIAGNOSIS — Z794 Long term (current) use of insulin: Secondary | ICD-10-CM | POA: Insufficient documentation

## 2023-11-18 DIAGNOSIS — Z87891 Personal history of nicotine dependence: Secondary | ICD-10-CM | POA: Diagnosis not present

## 2023-11-18 DIAGNOSIS — M109 Gout, unspecified: Secondary | ICD-10-CM | POA: Diagnosis not present

## 2023-11-18 DIAGNOSIS — W44F3XA Food entering into or through a natural orifice, initial encounter: Secondary | ICD-10-CM | POA: Diagnosis not present

## 2023-11-18 DIAGNOSIS — Z7969 Long term (current) use of other immunomodulators and immunosuppressants: Secondary | ICD-10-CM | POA: Insufficient documentation

## 2023-11-18 DIAGNOSIS — E119 Type 2 diabetes mellitus without complications: Secondary | ICD-10-CM | POA: Insufficient documentation

## 2023-11-18 DIAGNOSIS — K5100 Ulcerative (chronic) pancolitis without complications: Secondary | ICD-10-CM | POA: Diagnosis not present

## 2023-11-18 DIAGNOSIS — T18128A Food in esophagus causing other injury, initial encounter: Secondary | ICD-10-CM | POA: Diagnosis present

## 2023-11-18 LAB — BASIC METABOLIC PANEL WITH GFR
Anion gap: 10 (ref 5–15)
BUN: 11 mg/dL (ref 8–23)
CO2: 24 mmol/L (ref 22–32)
Calcium: 10 mg/dL (ref 8.9–10.3)
Chloride: 105 mmol/L (ref 98–111)
Creatinine, Ser: 1.01 mg/dL (ref 0.61–1.24)
GFR, Estimated: >60 mL/min (ref >60)
Glucose, Bld: 211 mg/dL — ABNORMAL HIGH (ref 70–99)
Potassium: 4.6 mmol/L (ref 3.5–5.1)
Sodium: 139 mmol/L (ref 135–145)

## 2023-11-18 LAB — CBC
HCT: 51 % (ref 39.0–52.0)
Hemoglobin: 17 g/dL (ref 13.0–17.0)
MCH: 32.3 pg (ref 26.0–34.0)
MCHC: 33.3 g/dL (ref 30.0–36.0)
MCV: 96.8 fL (ref 80.0–100.0)
Platelets: 179 10*3/uL (ref 150–400)
RBC: 5.27 MIL/uL (ref 4.22–5.81)
RDW: 13.8 % (ref 11.5–15.5)
WBC: 9.7 10*3/uL (ref 4.0–10.5)
nRBC: 0 % (ref 0.0–0.2)

## 2023-11-18 LAB — TROPONIN I (HIGH SENSITIVITY): Troponin I (High Sensitivity): 6 ng/L (ref <18)

## 2023-11-18 SURGERY — EGD (ESOPHAGOGASTRODUODENOSCOPY)
Anesthesia: General

## 2023-11-18 MED ORDER — PROPOFOL 10 MG/ML IV BOLUS
INTRAVENOUS | Status: AC
  Filled 2023-11-18: qty 20

## 2023-11-18 MED ORDER — LIDOCAINE HCL (PF) 2 % IJ SOLN
INTRAMUSCULAR | Status: AC
  Filled 2023-11-18: qty 5

## 2023-11-18 MED ORDER — FENTANYL CITRATE (PF) 100 MCG/2ML IJ SOLN
INTRAMUSCULAR | Status: DC | PRN
  Administered 2023-11-18: 50 ug via INTRAVENOUS

## 2023-11-18 MED ORDER — GLUCAGON HCL RDNA (DIAGNOSTIC) 1 MG IJ SOLR
1.0000 mg | Freq: Once | INTRAMUSCULAR | Status: AC
  Administered 2023-11-18: 1 mg via INTRAVENOUS
  Filled 2023-11-18: qty 1

## 2023-11-18 MED ORDER — DEXAMETHASONE SODIUM PHOSPHATE 10 MG/ML IJ SOLN
INTRAMUSCULAR | Status: DC | PRN
  Administered 2023-11-18: 10 mg via INTRAVENOUS

## 2023-11-18 MED ORDER — SODIUM CHLORIDE 0.9 % IV SOLN
INTRAVENOUS | Status: DC
  Administered 2023-11-18: 20 mL/h via INTRAVENOUS

## 2023-11-18 MED ORDER — PANTOPRAZOLE SODIUM 40 MG PO TBEC: 40.0000 mg | DELAYED_RELEASE_TABLET | Freq: Two times a day (BID) | ORAL | 1 refills | Status: AC

## 2023-11-18 MED ORDER — LIDOCAINE HCL (CARDIAC) PF 100 MG/5ML IV SOSY
PREFILLED_SYRINGE | INTRAVENOUS | Status: DC | PRN
  Administered 2023-11-18: 100 mg via INTRAVENOUS

## 2023-11-18 MED ORDER — ONDANSETRON HCL 4 MG/2ML IJ SOLN
INTRAMUSCULAR | Status: DC | PRN
  Administered 2023-11-18: 4 mg via INTRAVENOUS

## 2023-11-18 MED ORDER — SUCCINYLCHOLINE CHLORIDE 200 MG/10ML IV SOSY
PREFILLED_SYRINGE | INTRAVENOUS | Status: AC
  Filled 2023-11-18: qty 10

## 2023-11-18 MED ORDER — SUCCINYLCHOLINE CHLORIDE 200 MG/10ML IV SOSY
PREFILLED_SYRINGE | INTRAVENOUS | Status: DC | PRN
  Administered 2023-11-18: 100 mg via INTRAVENOUS

## 2023-11-18 MED ORDER — ONDANSETRON HCL 4 MG/2ML IJ SOLN
4.0000 mg | Freq: Once | INTRAMUSCULAR | Status: AC
  Administered 2023-11-18: 4 mg via INTRAVENOUS
  Filled 2023-11-18: qty 2

## 2023-11-18 MED ORDER — FENTANYL CITRATE (PF) 100 MCG/2ML IJ SOLN
INTRAMUSCULAR | Status: AC
  Filled 2023-11-18: qty 2

## 2023-11-18 MED ORDER — ONDANSETRON HCL 4 MG/2ML IJ SOLN: 4.0000 mg | Freq: Once | INTRAMUSCULAR | Status: DC | PRN

## 2023-11-18 MED ORDER — PROPOFOL 10 MG/ML IV BOLUS
INTRAVENOUS | Status: DC | PRN
  Administered 2023-11-18: 50 mg via INTRAVENOUS
  Administered 2023-11-18: 150 mg via INTRAVENOUS

## 2023-11-18 NOTE — ED Provider Notes (Signed)
 [facility] Provider Note    Event Date/Time   First MD Initiated Contact with Patient 11/18/23 1019     (approximate)   History  Swallowing problem   HPI  Timothy Hicks is a 63 y.o. male with history of diabetes, Crohn's disease who presents with inability to swallow.  Patient reports last night he was eating steak, afterwards reports he was unable to tolerate anything to eat or drink, it would come right back up again.  He notes he has always had some difficulty with getting food through the middle of his esophagus but never anything like this.     Physical Exam   Triage Vital Signs: ED Triage Vitals [11/18/23 0940]  Encounter Vitals Group     BP (!) 193/90     Girls Systolic BP Percentile      Girls Diastolic BP Percentile      Boys Systolic BP Percentile      Boys Diastolic BP Percentile      Pulse Rate (!) 112     Resp 17     Temp 98.4 F (36.9 C)     Temp Source Oral     SpO2 99 %     Weight 77.1 kg (169 lb 15.6 oz)     Height 1.854 m (6' 1)     Head Circumference      Peak Flow      Pain Score 10     Pain Loc      Pain Education      Exclude from Growth Chart     Most recent vital signs: Vitals:   11/18/23 0940 11/18/23 1035  BP: (!) 193/90 (!) 185/98  Pulse: (!) 112 (!) 107  Resp: 17 16  Temp: 98.4 F (36.9 C)   SpO2: 99% 97%     General: Awake, no distress.  CV:  Good peripheral perfusion.  Resp:  Normal effort.  Abd:  No distention.  Other:     ED Results / Procedures / Treatments   Labs (all labs ordered are listed, but only abnormal results are displayed) Labs Reviewed  BASIC METABOLIC PANEL WITH GFR - Abnormal; Notable for the following components:      Result Value   Glucose, Bld 211 (*)    All other components within normal limits  CBC  TROPONIN I (HIGH SENSITIVITY)  TROPONIN I (HIGH SENSITIVITY)     EKG  ED ECG REPORT I, Lamar Price, the attending physician, personally viewed and  interpreted this ECG.  Date: 11/18/2023  Rhythm: Sinus tachycardia QRS Axis: normal Intervals: normal ST/T Wave abnormalities: normal Narrative Interpretation: no evidence of acute ischemia    RADIOLOGY Chest x-ray interpreted me, no acute abnormality    PROCEDURES:  Critical Care performed:   Procedures   MEDICATIONS ORDERED IN ED: Medications  0.9 %  sodium chloride  infusion (has no administration in time range)  ondansetron (ZOFRAN) injection 4 mg (4 mg Intravenous Given 11/18/23 0949)  glucagon (human recombinant) (GLUCAGEN) injection 1 mg (1 mg Intravenous Given 11/18/23 1055)     IMPRESSION / MDM / ASSESSMENT AND PLAN / ED COURSE  I reviewed the triage vital signs and the nursing notes. Patient's presentation is most consistent with acute presentation with potential threat to life or bodily function.  Patient presents with symptoms consistent with esophageal food impaction.  Lab work reviewed and is reassuring.  Will trial glucagon but anticipate need for GI endoscopy  No improvement with glucagon, consulted with  Dr. Onita of GI, he will take for endoscopy      FINAL CLINICAL IMPRESSION(S) / ED DIAGNOSES   Final diagnoses:  Esophageal obstruction due to food impaction     Rx / DC Orders   ED Discharge Orders     None        Note:  This document was prepared using Dragon voice recognition software and may include unintentional dictation errors.   Arlander Charleston, MD 11/18/23 1154

## 2023-11-18 NOTE — Op Note (Signed)
 [facility] Gastroenterology Patient Name: Timothy Hicks Procedure Date: 11/18/2023 12:02 PM MRN: [id number] Account #: [id number] Date of Birth: 1960-12-17 Admit Type: Outpatient Age: 63 Room: [facility] ENDO ROOM 4 Gender: Male Note Status: Finalized Instrument Name: Upper Endoscope [phone] Procedure:             Upper GI endoscopy Indications:           Foreign body in the esophagus Providers:             Elspeth Ozell Jungling DO, DO Referring MD:          No Local Md, MD (Referring MD) Medicines:             Monitored Anesthesia Care Complications:         No immediate complications. Estimated blood loss: None. Procedure:             Pre-Anesthesia Assessment:                        - Prior to the procedure, a History and Physical was                         performed, and patient medications and allergies were                         reviewed. The patient is competent. The risks and                         benefits of the procedure and the sedation options and                         risks were discussed with the patient. All questions                         were answered and informed consent was obtained.                         Patient identification and proposed procedure were                         verified by the physician, the nurse, the anesthetist                         and the technician in the endoscopy suite. Mental                         Status Examination: alert and oriented. Airway                         Examination: normal oropharyngeal airway and neck                         mobility. Respiratory Examination: clear to                         auscultation. CV Examination: RRR, no murmurs, no S3                         or S4. Prophylactic Antibiotics: The patient does not  require prophylactic antibiotics. Prior                         Anticoagulants: The patient has taken no anticoagulant                         or  antiplatelet agents. ASA Grade Assessment: E -                         Emergency. After reviewing the risks and benefits, the                         patient was deemed in satisfactory condition to                         undergo the procedure. The anesthesia plan was to use                         general anesthesia. Immediately prior to                         administration of medications, the patient was                         re-assessed for adequacy to receive sedatives. The                         heart rate, respiratory rate, oxygen saturations,                         blood pressure, adequacy of pulmonary ventilation, and                         response to care were monitored throughout the                         procedure. The physical status of the patient was                         re-assessed after the procedure.                        After obtaining informed consent, the endoscope was                         passed under direct vision. Throughout the procedure,                         the patient's blood pressure, pulse, and oxygen                         saturations were monitored continuously. The Endoscope                         was introduced through the mouth, and advanced to the                         second part of duodenum. The upper GI endoscopy was  accomplished without difficulty. The patient tolerated                         the procedure well. Findings:      The duodenal bulb, first portion of the duodenum and second portion of       the duodenum were normal. Estimated blood loss: none.      A medium amount of food (residue) was found on the greater curvature of       the stomach. Large piece of steak that was cause of food impaction was       seen in the stomach upon entry into gastric lumen Estimated blood loss:       none.      Esophagogastric landmarks were identified: the gastroesophageal junction       was found at 40 cm from  the incisors.      The Z-line was regular. Estimated blood loss: none.      LA Grade B (one or more mucosal breaks greater than 5 mm, not extending       between the tops of two mucosal folds) esophagitis with no bleeding was       found. Estimated blood loss: none.      The exam of the esophagus was otherwise normal.      Upon entering the esophagus- the food impaction had resolved and passed       into the gastric lumen. A large piece of steak was appreciated in the       stomach. Impression:            - Normal duodenal bulb, first portion of the duodenum                         and second portion of the duodenum.                        - A medium amount of food (residue) in the stomach.                        - Esophagogastric landmarks identified.                        - Z-line regular.                        - LA Grade B reflux esophagitis with no bleeding.                        - No specimens collected. Recommendation:        - Patient has a contact number available for                         emergencies. The signs and symptoms of potential                         delayed complications were discussed with the patient.                         Return to normal activities tomorrow. Written                         discharge instructions  were provided to the patient.                        - Discharge patient to home.                        - Resume previous diet.                        - Continue present medications.                        - Use Protonix (pantoprazole) 40 mg PO BID for 8 weeks.                        - Repeat upper endoscopy in 8 weeks for retreatment.                        - Return to GI office at appointment to be scheduled.                        - The findings and recommendations were discussed with                         the patient.                        - The findings and recommendations were discussed with                         the patient's  family. Procedure Code(s):     --- Professional ---                        [phone], Esophagogastroduodenoscopy, flexible,                         transoral; diagnostic, including collection of                         specimen(s) by brushing or washing, when performed                         (separate procedure) Diagnosis Code(s):     --- Professional ---                        K21.00, Gastro-esophageal reflux disease with                         esophagitis, without bleeding                        T18.108A, Unspecified foreign body in esophagus                         causing other injury, initial encounter CPT copyright 2022 American Medical Association. All rights reserved. The codes documented in this report are preliminary and upon coder review may  be revised to meet current compliance requirements. Attending Participation:      I personally performed the entire procedure. Elspeth Jungling, DO Elspeth Ozell Jungling DO, DO 11/18/2023 12:25:41 PM This  report has been signed electronically. Number of Addenda: 0 Note Initiated On: 11/18/2023 12:02 PM Estimated Blood Loss:  Estimated blood loss: none.      [facility]

## 2023-11-18 NOTE — Interval H&P Note (Signed)
 History and Physical Interval Note: Consult Note from 11/18/23 was reviewed and there was no interval change after seeing and examining the patient.  Written consent was obtained from the patient after discussion of risks, benefits, and alternatives. Patient has consented to proceed with Esophagogastroduodenoscopy with possible intervention   11/18/2023 12:05 PM  Timothy Hicks  has presented today for surgery, with the diagnosis of food impaction.  The various methods of treatment have been discussed with the patient and family. After consideration of risks, benefits and other options for treatment, the patient has consented to  Procedure(s): EGD (ESOPHAGOGASTRODUODENOSCOPY) (N/A) as a surgical intervention.  The patient's history has been reviewed, patient examined, no change in status, stable for surgery.  I have reviewed the patient's chart and labs.  Questions were answered to the patient's satisfaction.     Elspeth Ozell Jungling

## 2023-11-18 NOTE — Anesthesia Postprocedure Evaluation (Signed)
 Anesthesia Post Note  Patient: Timothy Hicks  Procedure(s) Performed: EGD (ESOPHAGOGASTRODUODENOSCOPY)  Patient location during evaluation: PACU Anesthesia Type: General Level of consciousness: awake and alert, oriented and patient cooperative Pain management: pain level controlled Vital Signs Assessment: post-procedure vital signs reviewed and stable Respiratory status: spontaneous breathing, nonlabored ventilation and respiratory function stable Cardiovascular status: blood pressure returned to baseline and stable Postop Assessment: adequate PO intake Anesthetic complications: no   No notable events documented.   Last Vitals:  Vitals:   11/18/23 1238 11/18/23 1248  BP: (!) 153/84 (!) 146/80  Pulse: 94 94  Resp: 16 20  Temp:    SpO2: 97% 95%    Last Pain:  Vitals:   11/18/23 1248  TempSrc:   PainSc: 0-No pain                 Alfonso Ruths

## 2023-11-18 NOTE — Transfer of Care (Signed)
 Immediate Anesthesia Transfer of Care Note  Patient: Timothy Hicks  Procedure(s) Performed: EGD (ESOPHAGOGASTRODUODENOSCOPY)  Patient Location: PACU  Anesthesia Type:General  Level of Consciousness: awake, alert , and oriented  Airway & Oxygen Therapy: Patient Spontanous Breathing and Patient connected to nasal cannula oxygen  Post-op Assessment: Report given to RN, Post -op Vital signs reviewed and stable, and Patient moving all extremities  Post vital signs: Reviewed and stable  Last Vitals:  Vitals Value Taken Time  BP 150/106 11/18/23 12:28  Temp 37 C 11/18/23 12:28  Pulse 94 11/18/23 12:28  Resp 18 11/18/23 12:28  SpO2 98 % 11/18/23 12:28    Last Pain:  Vitals:   11/18/23 1228  TempSrc: Temporal  PainSc: 0-No pain         Complications: No notable events documented.

## 2023-11-18 NOTE — ED Notes (Signed)
 Primary RN made aware pt is being brought back by pt relations

## 2023-11-18 NOTE — Anesthesia Procedure Notes (Signed)
 Procedure Name: Intubation Date/Time: 11/18/2023 12:11 PM  Performed by: Landy Francena BIRCH, CRNAPre-anesthesia Checklist: Patient identified, Emergency Drugs available, Suction available and Patient being monitored Patient Re-evaluated:Patient Re-evaluated prior to induction Oxygen Delivery Method: Circle system utilized Preoxygenation: Pre-oxygenation with 100% oxygen Induction Type: IV induction, Rapid sequence and Cricoid Pressure applied Ventilation: Mask ventilation without difficulty Laryngoscope Size: McGrath and 4 Grade View: Grade I Tube type: Oral Tube size: 8.0 mm Number of attempts: 1 Airway Equipment and Method: Stylet, Oral airway and Bite block Placement Confirmation: ETT inserted through vocal cords under direct vision, positive ETCO2 and breath sounds checked- equal and bilateral Secured at: 23 cm Tube secured with: Tape Dental Injury: Teeth and Oropharynx as per pre-operative assessment
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
## 2023-11-18 NOTE — ED Triage Notes (Signed)
 First nurse note: Pt to ED via [facility]. Pt sent over due to pt having pain when swallowing and unable to keep anything down.

## 2023-11-18 NOTE — ED Triage Notes (Signed)
 Pt here with constant throat pain and not able to keep anything down. Family states everything is upper GI, pt denies abd pain. Pt denies fever as well.
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
## 2023-11-18 NOTE — Anesthesia Preprocedure Evaluation (Addendum)
 Anesthesia Evaluation  Patient identified by MRN, date of birth, ID band Patient awake    Reviewed: Allergy & Precautions, NPO status , Patient's Chart, lab work & pertinent test results  History of Anesthesia Complications Negative for: history of anesthetic complications  Airway Mallampati: IV   Neck ROM: Full    Dental  (+) Missing, Chipped   Pulmonary former smoker (quit 2000)   Pulmonary exam normal breath sounds clear to auscultation       Cardiovascular Normal cardiovascular exam Rhythm:Regular Rate:Normal  ECG 11/18/23:  Sinus tachycardia Rightward axis Anterior infarct (cited on or before 04-Nov-2021) Abnormal ECG When compared with ECG of 04-Nov-2021 08:59, No significant change was found  Echo 01/04/22:  NORMAL LEFT VENTRICULAR SYSTOLIC FUNCTION  NORMAL RIGHT VENTRICULAR SYSTOLIC FUNCTION  TRIVIAL REGURGITATION NOTED   NO VALVULAR STENOSIS  TRIVIAL MR, TR  EF 55%     Neuro/Psych negative neurological ROS     GI/Hepatic Crohn disease; UC   Endo/Other  diabetes, Type 2, Insulin Dependent    Renal/GU negative Renal ROS     Musculoskeletal Gout    Abdominal   Peds  Hematology  (+) Blood dyscrasia, anemia   Anesthesia Other Findings Cardiology note 07/14/22:  63 y.o. male with  1. Syncope, unspecified syncope type  2. Edema of both feet  3. Type 2 diabetes mellitus without complication, with long-term current use of insulin (CMS-HCC)   Plan   Syncope, stable, no recent epsiode, consider follow up with neurology was needed Edema, bilateral feet, recommend elevation, support stockings, and decreased sodium intake  Type 2 DM, continue current medical therapy, follow up with PCP Have patient follow up in 1 year   Return in about 1 year (around 07/15/2023).    Reproductive/Obstetrics                             Anesthesia Physical Anesthesia Plan  ASA: 3 and  emergent  Anesthesia Plan: General   Post-op Pain Management:    Induction: Rapid sequence and Intravenous  PONV Risk Score and Plan: 2 and Ondansetron, Dexamethasone and Treatment may vary due to age or medical condition  Airway Management Planned: Oral ETT  Additional Equipment:   Intra-op Plan:   Post-operative Plan: Extubation in OR  Informed Consent: I have reviewed the patients History and Physical, chart, labs and discussed the procedure including the risks, benefits and alternatives for the proposed anesthesia with the patient or authorized representative who has indicated his/her understanding and acceptance.     Dental advisory given  Plan Discussed with: CRNA  Anesthesia Plan Comments: (Patient consented for risks of anesthesia including but not limited to:  - adverse reactions to medications - damage to eyes, teeth, lips or other oral mucosa - nerve damage due to positioning  - sore throat or hoarseness - damage to heart, brain, nerves, lungs, other parts of body or loss of life  Informed patient about role of CRNA in peri- and intra-operative care.  Patient voiced understanding.)        Anesthesia Quick Evaluation

## 2023-11-20 ENCOUNTER — Encounter: Payer: Self-pay | Admitting: Gastroenterology

## 2024-02-12 ENCOUNTER — Ambulatory Visit: Admitting: Certified Registered"

## 2024-02-12 ENCOUNTER — Encounter
Admission: RE | Disposition: A | Discharge status: Home / Self Care | Payer: Self-pay | Source: Home / Self Care | Attending: Gastroenterology

## 2024-02-12 ENCOUNTER — Other Ambulatory Visit: Payer: Self-pay

## 2024-02-12 ENCOUNTER — Ambulatory Visit
Admission: RE | Admit: 2024-02-12 | Discharge: 2024-02-12 | Disposition: A | Discharge status: Home / Self Care | Attending: Gastroenterology | Admitting: Gastroenterology

## 2024-02-12 DIAGNOSIS — Q399 Congenital malformation of esophagus, unspecified: Secondary | ICD-10-CM | POA: Insufficient documentation

## 2024-02-12 DIAGNOSIS — Z794 Long term (current) use of insulin: Secondary | ICD-10-CM | POA: Insufficient documentation

## 2024-02-12 DIAGNOSIS — Z7984 Long term (current) use of oral hypoglycemic drugs: Secondary | ICD-10-CM | POA: Insufficient documentation

## 2024-02-12 DIAGNOSIS — E119 Type 2 diabetes mellitus without complications: Secondary | ICD-10-CM | POA: Diagnosis not present

## 2024-02-12 DIAGNOSIS — K2100 Gastro-esophageal reflux disease with esophagitis, without bleeding: Secondary | ICD-10-CM | POA: Insufficient documentation

## 2024-02-12 DIAGNOSIS — R1314 Dysphagia, pharyngoesophageal phase: Secondary | ICD-10-CM | POA: Insufficient documentation

## 2024-02-12 LAB — GLUCOSE, CAPILLARY: Glucose-Capillary: 118 mg/dL — ABNORMAL HIGH (ref 70–99)

## 2024-02-12 SURGERY — EGD (ESOPHAGOGASTRODUODENOSCOPY)
Anesthesia: General

## 2024-02-12 MED ORDER — SODIUM CHLORIDE 0.9 % IV SOLN
INTRAVENOUS | Status: DC
  Administered 2024-02-12: 20 mL/h via INTRAVENOUS

## 2024-02-12 MED ORDER — PROPOFOL 500 MG/50ML IV EMUL
INTRAVENOUS | Status: DC | PRN
  Administered 2024-02-12: 120 ug/kg/min via INTRAVENOUS

## 2024-02-12 MED ORDER — PROPOFOL 10 MG/ML IV BOLUS
INTRAVENOUS | Status: DC | PRN
  Administered 2024-02-12: 70 mg via INTRAVENOUS

## 2024-02-12 MED ORDER — MIDAZOLAM HCL 5 MG/5ML IJ SOLN
INTRAMUSCULAR | Status: DC | PRN
  Administered 2024-02-12: 2 mg via INTRAVENOUS

## 2024-02-12 MED ORDER — GLYCOPYRROLATE 0.2 MG/ML IJ SOLN
INTRAMUSCULAR | Status: DC | PRN
  Administered 2024-02-12: .2 mg via INTRAVENOUS

## 2024-02-12 MED ORDER — LIDOCAINE 2% (20 MG/ML) 5 ML SYRINGE
INTRAMUSCULAR | Status: DC | PRN
  Administered 2024-02-12: 20 mg via INTRAVENOUS

## 2024-02-12 MED ORDER — MIDAZOLAM HCL 2 MG/2ML IJ SOLN
INTRAMUSCULAR | Status: AC
  Filled 2024-02-12: qty 2
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
## 2024-02-12 NOTE — Op Note (Signed)
 [facility] Gastroenterology Patient Name: Timothy Hicks Procedure Date: 02/12/2024 10:45 AM MRN: [id number] Account #: [id number] Date of Birth: [birth year] Admit Type: Outpatient Age: 63 Room: [facility] ENDO ROOM 3 Gender: Male Note Status: Finalized Instrument Name: Upper GI Scope [id number] Procedure:             Upper GI endoscopy Indications:           Dysphagia, Reflux esophagitis Providers:             Ole Schick MD, MD Medicines:             Monitored Anesthesia Care Complications:         No immediate complications. Estimated blood loss:                         Minimal. Procedure:             Pre-Anesthesia Assessment:                        - Prior to the procedure, a History and Physical was                         performed, and patient medications and allergies were                         reviewed. The patient is competent. The risks and                         benefits of the procedure and the sedation options and                         risks were discussed with the patient. All questions                         were answered and informed consent was obtained.                         Patient identification and proposed procedure were                         verified by the physician, the nurse, the                         anesthesiologist, the anesthetist and the technician                         in the endoscopy suite. Mental Status Examination:                         alert and oriented. Airway Examination: normal                         oropharyngeal airway and neck mobility. Respiratory                         Examination: clear to auscultation. CV Examination:                         normal. Prophylactic Antibiotics: The patient does not  require prophylactic antibiotics. Prior                         Anticoagulants: The patient has taken no anticoagulant                         or antiplatelet agents. ASA Grade  Assessment: III - A                         patient with severe systemic disease. After reviewing                         the risks and benefits, the patient was deemed in                         satisfactory condition to undergo the procedure. The                         anesthesia plan was to use monitored anesthesia care                         (MAC). Immediately prior to administration of                         medications, the patient was re-assessed for adequacy                         to receive sedatives. The heart rate, respiratory                         rate, oxygen saturations, blood pressure, adequacy of                         pulmonary ventilation, and response to care were                         monitored throughout the procedure. The physical                         status of the patient was re-assessed after the                         procedure.                        After obtaining informed consent, the endoscope was                         passed under direct vision. Throughout the procedure,                         the patient's blood pressure, pulse, and oxygen                         saturations were monitored continuously. The Endoscope                         was introduced through the mouth, and advanced to the  second part of duodenum. The upper GI endoscopy was                         accomplished without difficulty. The patient tolerated                         the procedure well. Findings:      No endoscopic abnormality was evident in the esophagus to explain the       patient's complaint of dysphagia. It was decided, however, to proceed       with dilation of the lower third of the esophagus. A TTS dilator was       passed through the scope. Dilation with a 15-16.5-18 mm balloon dilator       was performed to 18 mm. The dilation site was examined and showed no       change. Estimated blood loss was minimal.      The lower third of  the esophagus was mildly tortuous.      Normal mucosa was found in the entire esophagus. Biopsies were taken       with a cold forceps for Helicobacter pylori testing. Estimated blood       loss was minimal.      The entire examined stomach was normal.      The examined duodenum was normal. Impression:            - No endoscopic esophageal abnormality to explain                         patient's dysphagia. Esophagus dilated. Dilated.                        - Tortuous esophagus.                        - Normal mucosa was found in the entire esophagus.                         Biopsied.                        - Normal stomach.                        - Normal examined duodenum. Recommendation:        - Discharge patient to home.                        - Resume previous diet.                        - Continue present medications.                        - Await pathology results.                        - Return to referring physician as previously                         scheduled. Procedure Code(s):     --- Professional ---  [phone], Esophagogastroduodenoscopy, flexible,                         transoral; with transendoscopic balloon dilation of                         esophagus (less than 30 mm diameter) Diagnosis Code(s):     --- Professional ---                        R13.10, Dysphagia, unspecified                        Q39.9, Congenital malformation of esophagus,                         unspecified                        K21.00, Gastro-esophageal reflux disease with                         esophagitis, without bleeding CPT copyright 2022 [employer]. All rights reserved. The codes documented in this report are preliminary and upon coder review may  be revised to meet current compliance requirements. Ole Schick MD, MD 02/12/2024 11:19:27 AM Number of Addenda: 0 Note Initiated On: 02/12/2024 10:45 AM Estimated Blood Loss:  Estimated blood  loss was minimal.      [facility]

## 2024-02-12 NOTE — Interval H&P Note (Signed)
 History and Physical Interval Note:  02/12/2024 10:59 AM  Timothy Hicks  has presented today for surgery, with the diagnosis of pharyngoesophageal dysphagia.  The various methods of treatment have been discussed with the patient and family. After consideration of risks, benefits and other options for treatment, the patient has consented to  Procedure(s): EGD (ESOPHAGOGASTRODUODENOSCOPY) (N/A) as a surgical intervention.  The patient's history has been reviewed, patient examined, no change in status, stable for surgery.  I have reviewed the patient's chart and labs.  Questions were answered to the patient's satisfaction.     Ole [name]  Ok to proceed with EGD
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
## 2024-02-12 NOTE — Anesthesia Postprocedure Evaluation (Signed)
 Anesthesia Post Note  Patient: Timothy Hicks  Procedure(s) Performed: EGD (ESOPHAGOGASTRODUODENOSCOPY)  Patient location during evaluation: PACU Anesthesia Type: General Level of consciousness: awake and alert Pain management: pain level controlled Vital Signs Assessment: post-procedure vital signs reviewed and stable Respiratory status: spontaneous breathing, nonlabored ventilation, respiratory function stable and patient connected to nasal cannula oxygen Cardiovascular status: blood pressure returned to baseline and stable Postop Assessment: no apparent nausea or vomiting Anesthetic complications: no   No notable events documented.   Last Vitals:  Vitals:   02/12/24 1126 02/12/24 1136  BP: 134/84 (!) 151/94  Pulse: 100 (!) 103  Resp: 18 17  Temp:    SpO2: 100% 99%    Last Pain:  Vitals:   02/12/24 1136  TempSrc:   PainSc: 0-No pain                 Lynwood KANDICE Clause

## 2024-02-12 NOTE — Transfer of Care (Signed)
 Immediate Anesthesia Transfer of Care Note  Patient: Timothy Hicks  Procedure(s) Performed: EGD (ESOPHAGOGASTRODUODENOSCOPY)  Patient Location: Endoscopy Unit  Anesthesia Type:General  Level of Consciousness: drowsy  Airway & Oxygen Therapy: Patient Spontanous Breathing  Post-op Assessment: Report given to RN and Post -op Vital signs reviewed and stable  Post vital signs: Reviewed  Last Vitals:  Vitals Value Taken Time  BP 131/67   Temp    Pulse 103 02/12/24 11:16  Resp 21 02/12/24 11:16  SpO2 100 % 02/12/24 11:16  Vitals shown include unfiled device data.  Last Pain:  Vitals:   02/12/24 1021  TempSrc: Temporal  PainSc: 0-No pain         Complications: No notable events documented.

## 2024-02-13 LAB — SURGICAL PATHOLOGY
